# Patient Record
Sex: Female | Born: 1965 | Race: Asian | Hispanic: No | Marital: Married | Smoking: Never smoker
Health system: Southern US, Community
[De-identification: ages and names within clinical notes are randomized; demographics above are authoritative.]

## PROBLEM LIST (undated history)

## (undated) DIAGNOSIS — R7309 Other abnormal glucose: Secondary | ICD-10-CM

## (undated) DIAGNOSIS — E042 Nontoxic multinodular goiter: Secondary | ICD-10-CM

## (undated) DIAGNOSIS — E039 Hypothyroidism, unspecified: Secondary | ICD-10-CM

## (undated) DIAGNOSIS — D649 Anemia, unspecified: Secondary | ICD-10-CM

## (undated) HISTORY — PX: BREAST BIOPSY: SHX20

## (undated) HISTORY — DX: Hypothyroidism, unspecified: E03.9

## (undated) HISTORY — DX: Nontoxic multinodular goiter: E04.2

## (undated) HISTORY — DX: Other abnormal glucose: R73.09

---

## 2004-05-19 HISTORY — PX: OVARIAN CYST SURGERY: SHX726

## 2006-03-31 ENCOUNTER — Ambulatory Visit: Payer: Self-pay | Admitting: Internal Medicine

## 2006-04-01 ENCOUNTER — Ambulatory Visit (HOSPITAL_COMMUNITY): Admission: RE | Admit: 2006-04-01 | Discharge: 2006-04-01 | Payer: Self-pay | Admitting: Internal Medicine

## 2006-04-14 ENCOUNTER — Ambulatory Visit: Payer: Self-pay | Admitting: Internal Medicine

## 2006-07-14 ENCOUNTER — Ambulatory Visit: Payer: Self-pay | Admitting: Internal Medicine

## 2006-07-14 LAB — CONVERTED CEMR LAB: TSH: 0.02 microintl units/mL — ABNORMAL LOW (ref 0.35–5.50)

## 2006-09-22 ENCOUNTER — Ambulatory Visit: Payer: Self-pay | Admitting: Internal Medicine

## 2006-09-22 LAB — CONVERTED CEMR LAB: TSH: 0.1 microintl units/mL — ABNORMAL LOW (ref 0.35–5.50)

## 2006-10-08 ENCOUNTER — Encounter: Admission: RE | Admit: 2006-10-08 | Discharge: 2006-10-08 | Payer: Self-pay | Admitting: Internal Medicine

## 2006-10-22 ENCOUNTER — Ambulatory Visit: Payer: Self-pay | Admitting: Internal Medicine

## 2006-10-22 LAB — CONVERTED CEMR LAB: TSH: 0.33 microintl units/mL — ABNORMAL LOW (ref 0.35–5.50)

## 2006-11-26 ENCOUNTER — Ambulatory Visit: Payer: Self-pay | Admitting: Internal Medicine

## 2006-11-26 LAB — CONVERTED CEMR LAB: TSH: 3.29 microintl units/mL (ref 0.35–5.50)

## 2006-12-05 ENCOUNTER — Ambulatory Visit: Payer: Self-pay | Admitting: Internal Medicine

## 2006-12-05 ENCOUNTER — Ambulatory Visit (HOSPITAL_COMMUNITY): Admission: RE | Admit: 2006-12-05 | Discharge: 2006-12-05 | Payer: Self-pay | Admitting: Internal Medicine

## 2006-12-05 LAB — CONVERTED CEMR LAB
Anti Nuclear Antibody(ANA): NEGATIVE
Rheumatoid fact SerPl-aCnc: <20 intl units/mL — ABNORMAL LOW (ref 0.0–20.0)
Sed Rate: 10 mm/hr (ref 0–25)
Uric Acid, Serum: 3.6 mg/dL (ref 2.4–7.0)

## 2007-04-09 ENCOUNTER — Ambulatory Visit: Payer: Self-pay | Admitting: Internal Medicine

## 2007-04-09 LAB — CONVERTED CEMR LAB: TSH: 1.66 microintl units/mL (ref 0.35–5.50)

## 2007-10-12 ENCOUNTER — Ambulatory Visit: Payer: Self-pay | Admitting: Internal Medicine

## 2007-10-12 DIAGNOSIS — E039 Hypothyroidism, unspecified: Secondary | ICD-10-CM

## 2007-10-13 LAB — CONVERTED CEMR LAB: TSH: 5.15 microintl units/mL (ref 0.35–5.50)

## 2007-10-14 ENCOUNTER — Telehealth: Payer: Self-pay | Admitting: Internal Medicine

## 2007-10-14 DIAGNOSIS — E049 Nontoxic goiter, unspecified: Secondary | ICD-10-CM | POA: Insufficient documentation

## 2007-10-21 ENCOUNTER — Encounter (INDEPENDENT_AMBULATORY_CARE_PROVIDER_SITE_OTHER): Payer: Self-pay | Admitting: *Deleted

## 2007-11-12 ENCOUNTER — Encounter: Admission: RE | Admit: 2007-11-12 | Discharge: 2007-11-12 | Payer: Self-pay | Admitting: Endocrinology

## 2007-11-23 ENCOUNTER — Ambulatory Visit: Payer: Self-pay | Admitting: Internal Medicine

## 2007-11-23 LAB — CONVERTED CEMR LAB: TSH: 0.14 microintl units/mL — ABNORMAL LOW (ref 0.35–5.50)

## 2008-01-06 ENCOUNTER — Ambulatory Visit: Payer: Self-pay | Admitting: Internal Medicine

## 2008-01-06 LAB — CONVERTED CEMR LAB: TSH: 0.17 microintl units/mL — ABNORMAL LOW (ref 0.35–5.50)

## 2008-01-07 ENCOUNTER — Telehealth: Payer: Self-pay | Admitting: Internal Medicine

## 2008-04-29 ENCOUNTER — Ambulatory Visit: Payer: Self-pay | Admitting: Internal Medicine

## 2008-04-29 LAB — CONVERTED CEMR LAB: TSH: 1.1 microintl units/mL (ref 0.35–5.50)

## 2008-04-30 ENCOUNTER — Telehealth: Payer: Self-pay | Admitting: Internal Medicine

## 2008-10-13 ENCOUNTER — Telehealth: Payer: Self-pay | Admitting: Internal Medicine

## 2008-10-17 ENCOUNTER — Ambulatory Visit: Payer: Self-pay | Admitting: Internal Medicine

## 2008-10-17 ENCOUNTER — Telehealth: Payer: Self-pay | Admitting: Internal Medicine

## 2008-10-17 DIAGNOSIS — D508 Other iron deficiency anemias: Secondary | ICD-10-CM | POA: Insufficient documentation

## 2008-10-17 LAB — CONVERTED CEMR LAB
ALT: 14 units/L (ref 0–35)
AST: 20 units/L (ref 0–37)
BUN: 9 mg/dL (ref 6–23)
Basophils Absolute: 0.1 10*3/uL (ref 0.0–0.1)
Basophils Relative: 1.8 % (ref 0.0–3.0)
CO2: 27 meq/L (ref 19–32)
Calcium: 9 mg/dL (ref 8.4–10.5)
Chloride: 107 meq/L (ref 96–112)
Cholesterol: 167 mg/dL (ref 0–200)
Creatinine, Ser: 0.4 mg/dL (ref 0.4–1.2)
Eosinophils Absolute: 0.4 10*3/uL (ref 0.0–0.7)
Eosinophils Relative: 7.6 % — ABNORMAL HIGH (ref 0.0–5.0)
GFR calc Af Amer: 223 mL/min
GFR calc non Af Amer: 184 mL/min
Glucose, Bld: 101 mg/dL — ABNORMAL HIGH (ref 70–99)
HCT: 25.2 % — ABNORMAL LOW (ref 36.0–46.0)
HDL: 39.8 mg/dL (ref >39.0)
Hemoglobin: 7.6 g/dL — CL (ref 12.0–15.0)
LDL Cholesterol: 112 mg/dL — ABNORMAL HIGH (ref 0–99)
Lymphocytes Relative: 38.1 % (ref 12.0–46.0)
MCHC: 30.1 g/dL (ref 30.0–36.0)
MCV: 65.8 fL — ABNORMAL LOW (ref 78.0–100.0)
Monocytes Absolute: 0.5 10*3/uL (ref 0.1–1.0)
Monocytes Relative: 10.4 % (ref 3.0–12.0)
Neutro Abs: 2.1 10*3/uL (ref 1.4–7.7)
Neutrophils Relative %: 42.1 % — ABNORMAL LOW (ref 43.0–77.0)
Platelets: 379 10*3/uL (ref 150–400)
Potassium: 4.3 meq/L (ref 3.5–5.1)
RBC: 3.82 M/uL — ABNORMAL LOW (ref 3.87–5.11)
RDW: 18 % — ABNORMAL HIGH (ref 11.5–14.6)
Sodium: 139 meq/L (ref 135–145)
TSH: 0.45 microintl units/mL (ref 0.35–5.50)
Total CHOL/HDL Ratio: 4.2
Triglycerides: 77 mg/dL (ref 0–149)
VLDL: 15 mg/dL (ref 0–40)
WBC: 5 10*3/uL (ref 4.5–10.5)

## 2008-10-19 ENCOUNTER — Ambulatory Visit (HOSPITAL_BASED_OUTPATIENT_CLINIC_OR_DEPARTMENT_OTHER): Admission: RE | Admit: 2008-10-19 | Discharge: 2008-10-19 | Payer: Self-pay | Admitting: Internal Medicine

## 2008-10-19 ENCOUNTER — Ambulatory Visit: Payer: Self-pay | Admitting: Interventional Radiology

## 2008-11-21 ENCOUNTER — Ambulatory Visit: Payer: Self-pay | Admitting: Internal Medicine

## 2008-11-21 LAB — CONVERTED CEMR LAB
Basophils Absolute: 0.1 10*3/uL (ref 0.0–0.1)
Basophils Relative: 0.8 % (ref 0.0–3.0)
Eosinophils Absolute: 0.2 10*3/uL (ref 0.0–0.7)
Eosinophils Relative: 3.3 % (ref 0.0–5.0)
HCT: 36.4 % (ref 36.0–46.0)
Hemoglobin: 11.9 g/dL — ABNORMAL LOW (ref 12.0–15.0)
Lymphocytes Relative: 26.3 % (ref 12.0–46.0)
Lymphs Abs: 1.9 10*3/uL (ref 0.7–4.0)
MCHC: 32.7 g/dL (ref 30.0–36.0)
MCV: 79.4 fL (ref 78.0–100.0)
Monocytes Absolute: 0.6 10*3/uL (ref 0.1–1.0)
Monocytes Relative: 8.3 % (ref 3.0–12.0)
Neutro Abs: 4.5 10*3/uL (ref 1.4–7.7)
Neutrophils Relative %: 61.3 % (ref 43.0–77.0)
Platelets: 271 10*3/uL (ref 150.0–400.0)
RBC: 4.58 M/uL (ref 3.87–5.11)
RDW: 30.5 % — ABNORMAL HIGH (ref 11.5–14.6)
WBC: 7.3 10*3/uL (ref 4.5–10.5)

## 2008-11-27 ENCOUNTER — Telehealth: Payer: Self-pay | Admitting: Internal Medicine

## 2009-02-02 ENCOUNTER — Ambulatory Visit: Payer: Self-pay | Admitting: Internal Medicine

## 2009-02-02 ENCOUNTER — Telehealth: Payer: Self-pay | Admitting: Internal Medicine

## 2009-02-02 LAB — CONVERTED CEMR LAB
Basophils Absolute: 0 10*3/uL (ref 0.0–0.1)
Basophils Relative: 0.3 % (ref 0.0–3.0)
Eosinophils Absolute: 0.3 10*3/uL (ref 0.0–0.7)
Eosinophils Relative: 4.5 % (ref 0.0–5.0)
HCT: 34.8 % — ABNORMAL LOW (ref 36.0–46.0)
Hemoglobin: 11.7 g/dL — ABNORMAL LOW (ref 12.0–15.0)
Lymphocytes Relative: 27.3 % (ref 12.0–46.0)
Lymphs Abs: 2.1 10*3/uL (ref 0.7–4.0)
MCHC: 33.5 g/dL (ref 30.0–36.0)
MCV: 89.1 fL (ref 78.0–100.0)
Monocytes Absolute: 0.7 10*3/uL (ref 0.1–1.0)
Monocytes Relative: 9.7 % (ref 3.0–12.0)
Neutro Abs: 4.5 10*3/uL (ref 1.4–7.7)
Neutrophils Relative %: 58.2 % (ref 43.0–77.0)
Platelets: 254 10*3/uL (ref 150.0–400.0)
RBC: 3.9 M/uL (ref 3.87–5.11)
RDW: 12.8 % (ref 11.5–14.6)
WBC: 7.6 10*3/uL (ref 4.5–10.5)

## 2009-08-15 IMAGING — US US SOFT TISSUE HEAD/NECK
1 series · 14 of 25 positions shown · non-contrast
Comparison: 11/12/2007

CLINICAL DATA: Goiter

THYROID ULTRASOUND
TECHNIQUE: Ultrasound examination of the thyroid gland and
adjacent soft tissues was performed.

[Series 1: us soft tissue head/neck · 0.07mm/px · 14 of 27 slices shown]
[im 1/27]
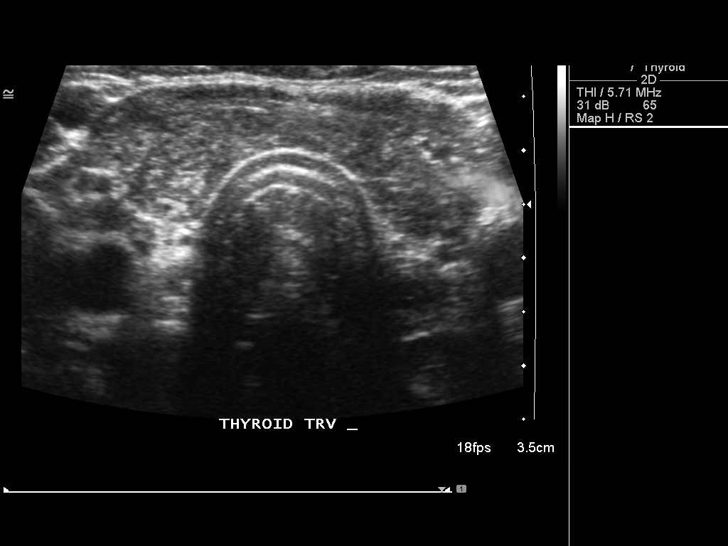
[im 3/27]
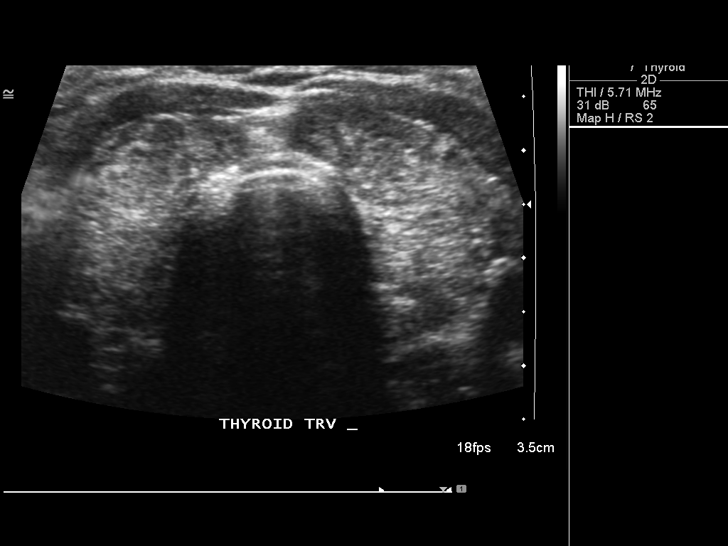
[im 5/27]
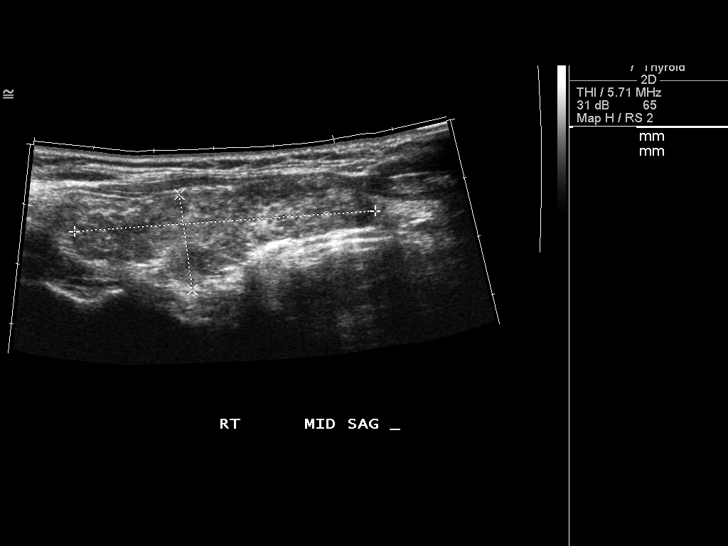
[im 7/27]
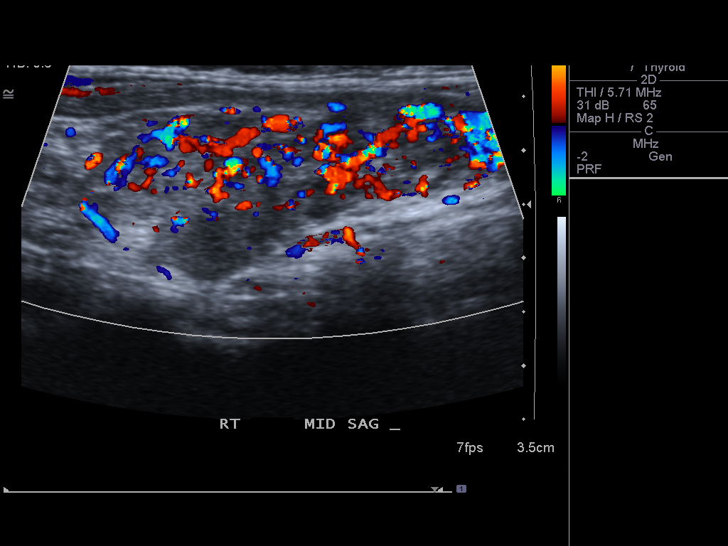
[im 9/27]
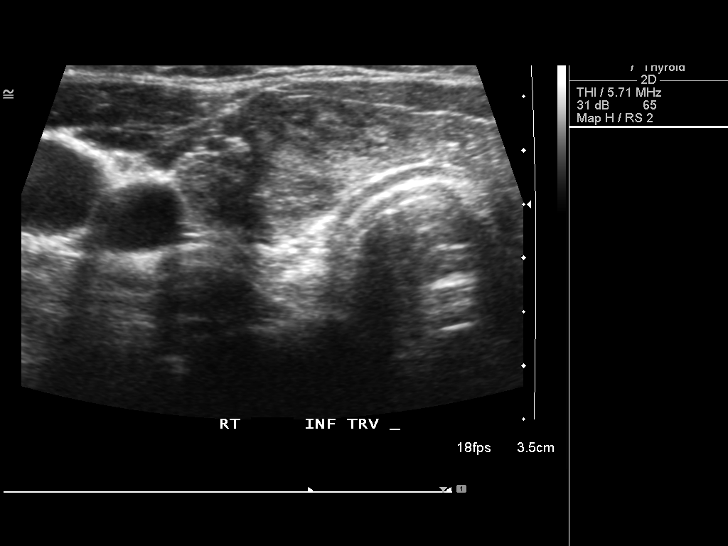
[im 10/27]
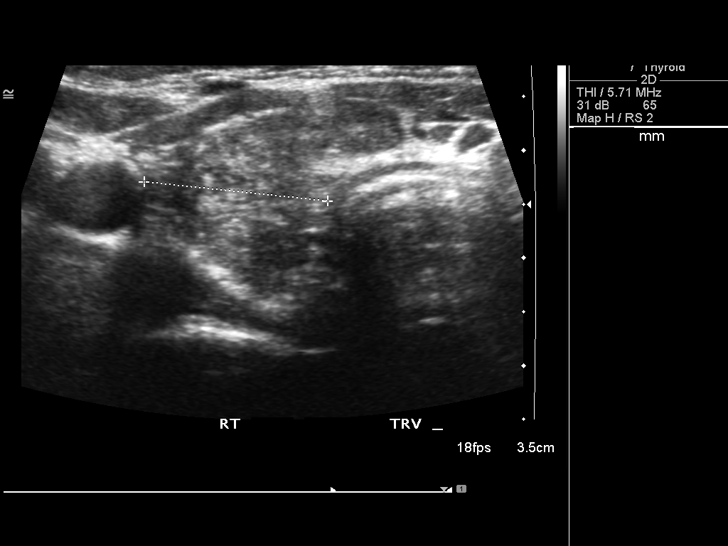
[im 12/27]
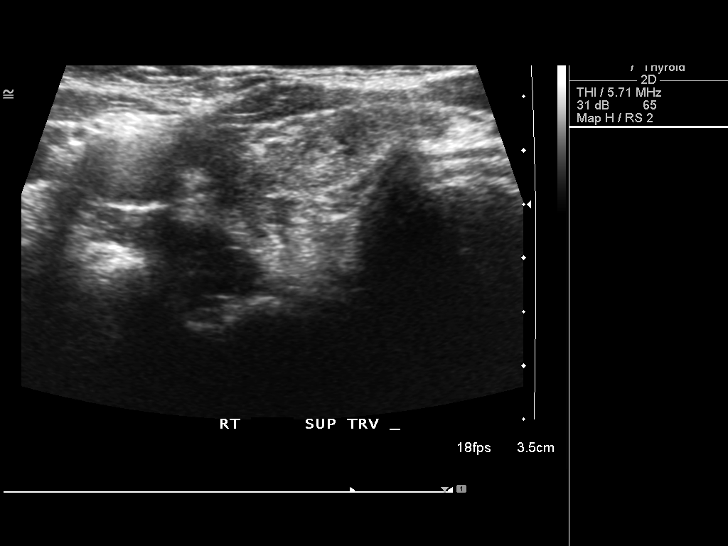
[im 15/27]
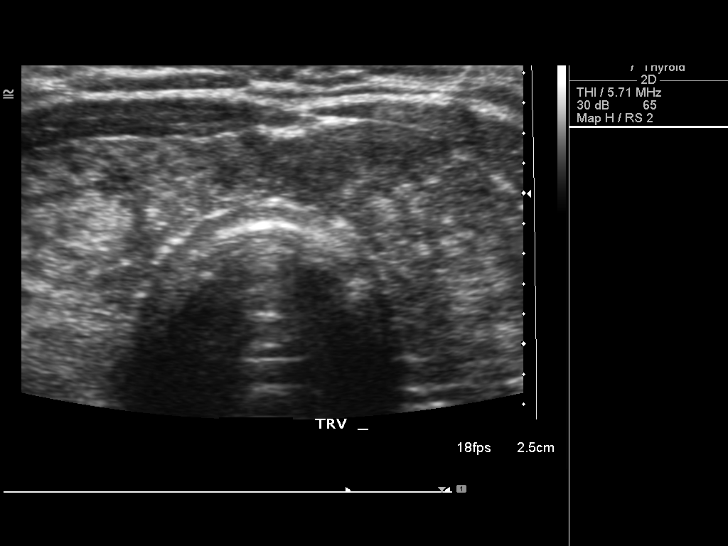
[im 17/27]
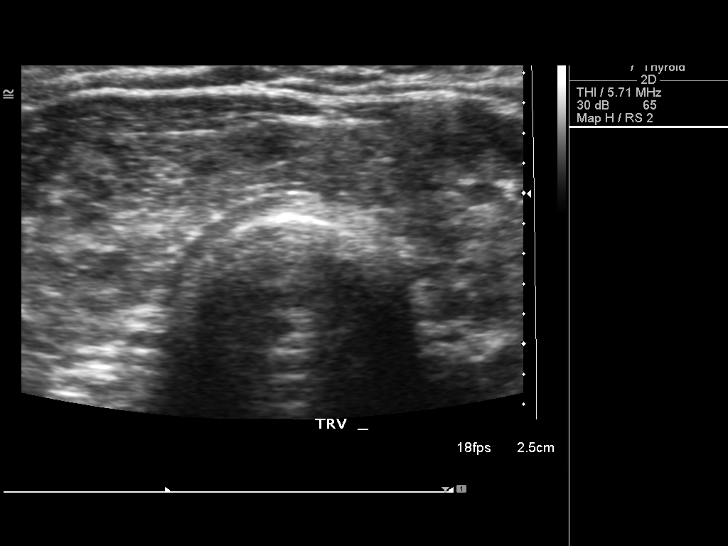
[im 18/27]
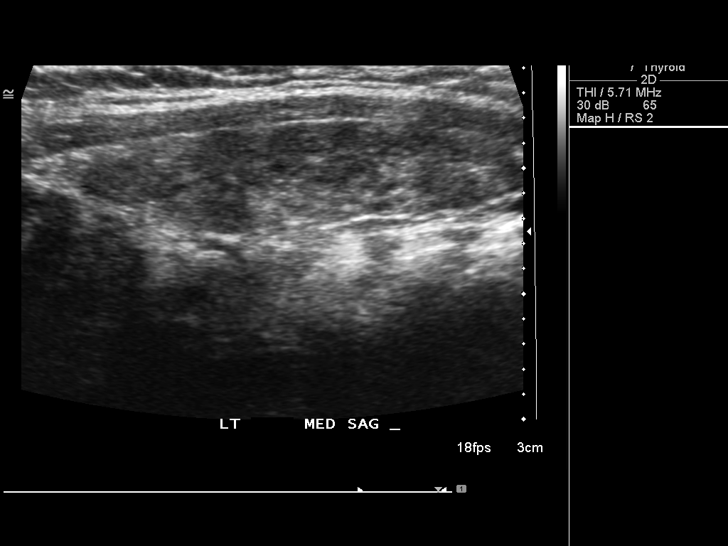
[im 20/27]
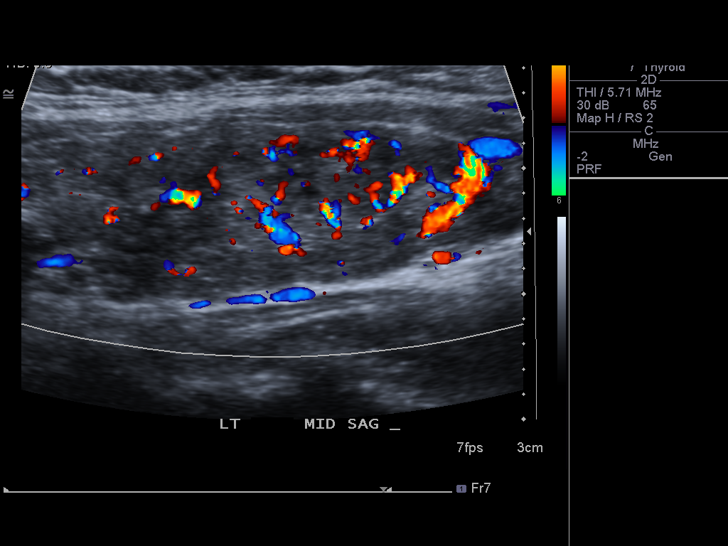
[im 22/27]
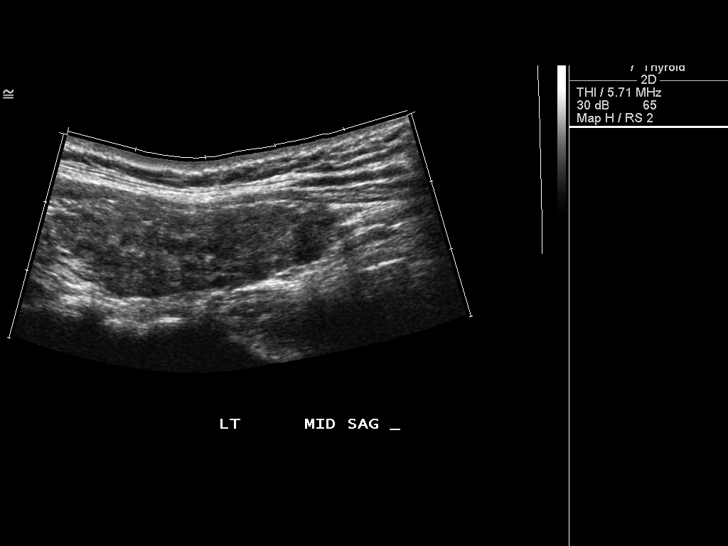
[im 24/27]
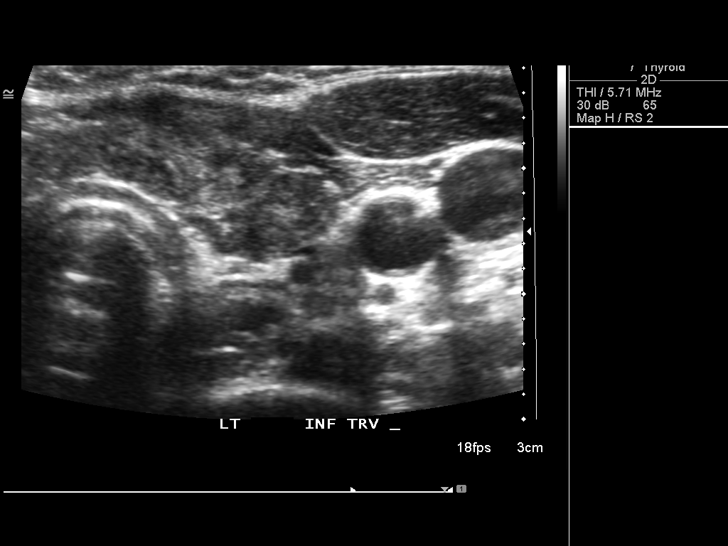
[im 27/27]
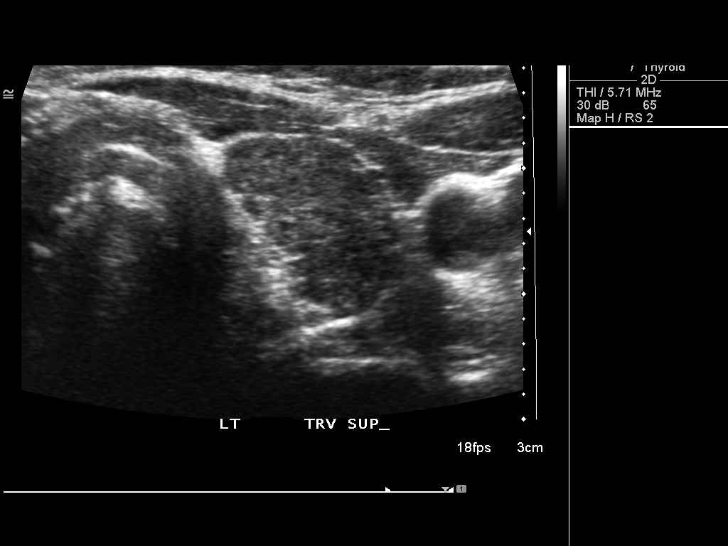

[14 of 25 positions shown; findings below may reference images not displayed]

FINDINGS: Mild goiter shows no significant change in overall size
since prior study, with the right lobe measuring 16 x 17 x 15 mm,
the left lobe measuring 14 x 15 x 43 mm. The thyroid isthmus
measures 5 mm in thickness.

The thyroid gland shows diffusely inhomogeneous echo texture. No
discrete or dominant thyroid nodules are identified on today's
study.
IMPRESSION: Stable mild goiter, with diffusely inhomogeneous thyroid
echotexture. No discrete or dominant thyroid nodule identified.

## 2009-08-24 ENCOUNTER — Telehealth: Payer: Self-pay | Admitting: Internal Medicine

## 2009-08-28 ENCOUNTER — Ambulatory Visit: Payer: Self-pay | Admitting: Internal Medicine

## 2009-08-28 LAB — CONVERTED CEMR LAB
Basophils Absolute: 0.1 10*3/uL (ref 0.0–0.1)
Basophils Relative: 1 % (ref 0.0–3.0)
Eosinophils Absolute: 0.2 10*3/uL (ref 0.0–0.7)
Eosinophils Relative: 2.6 % (ref 0.0–5.0)
HCT: 30.7 % — ABNORMAL LOW (ref 36.0–46.0)
Hemoglobin: 9.9 g/dL — ABNORMAL LOW (ref 12.0–15.0)
Lymphocytes Relative: 32.2 % (ref 12.0–46.0)
Lymphs Abs: 2 10*3/uL (ref 0.7–4.0)
MCHC: 32.2 g/dL (ref 30.0–36.0)
MCV: 84.8 fL (ref 78.0–100.0)
Monocytes Absolute: 0.6 10*3/uL (ref 0.1–1.0)
Monocytes Relative: 9.9 % (ref 3.0–12.0)
Neutro Abs: 3.2 10*3/uL (ref 1.4–7.7)
Neutrophils Relative %: 54.3 % (ref 43.0–77.0)
Platelets: 340 10*3/uL (ref 150.0–400.0)
RBC: 3.62 M/uL — ABNORMAL LOW (ref 3.87–5.11)
RDW: 13.1 % (ref 11.5–14.6)
TSH: 0.84 microintl units/mL (ref 0.35–5.50)
WBC: 6.1 10*3/uL (ref 4.5–10.5)

## 2009-08-30 ENCOUNTER — Telehealth: Payer: Self-pay | Admitting: Internal Medicine

## 2009-08-30 ENCOUNTER — Encounter (INDEPENDENT_AMBULATORY_CARE_PROVIDER_SITE_OTHER): Payer: Self-pay | Admitting: *Deleted

## 2009-10-31 ENCOUNTER — Telehealth: Payer: Self-pay | Admitting: Internal Medicine

## 2009-10-31 ENCOUNTER — Ambulatory Visit: Payer: Self-pay | Admitting: Diagnostic Radiology

## 2009-10-31 ENCOUNTER — Ambulatory Visit (HOSPITAL_BASED_OUTPATIENT_CLINIC_OR_DEPARTMENT_OTHER): Admission: RE | Admit: 2009-10-31 | Discharge: 2009-10-31 | Payer: Self-pay | Admitting: Internal Medicine

## 2010-02-08 ENCOUNTER — Telehealth: Payer: Self-pay | Admitting: Internal Medicine

## 2010-02-09 ENCOUNTER — Ambulatory Visit: Payer: Self-pay | Admitting: Internal Medicine

## 2010-02-09 ENCOUNTER — Telehealth: Payer: Self-pay | Admitting: Internal Medicine

## 2010-02-09 LAB — CONVERTED CEMR LAB
Basophils Absolute: 0 10*3/uL (ref 0.0–0.1)
Basophils Relative: 0.8 % (ref 0.0–3.0)
Eosinophils Absolute: 0.2 10*3/uL (ref 0.0–0.7)
Eosinophils Relative: 3.6 % (ref 0.0–5.0)
HCT: 27.9 % — ABNORMAL LOW (ref 36.0–46.0)
Hemoglobin: 9.1 g/dL — ABNORMAL LOW (ref 12.0–15.0)
Lymphocytes Relative: 31.2 % (ref 12.0–46.0)
Lymphs Abs: 2 10*3/uL (ref 0.7–4.0)
MCHC: 32.7 g/dL (ref 30.0–36.0)
MCV: 75.8 fL — ABNORMAL LOW (ref 78.0–100.0)
Monocytes Absolute: 0.6 10*3/uL (ref 0.1–1.0)
Monocytes Relative: 9.8 % (ref 3.0–12.0)
Neutro Abs: 3.4 10*3/uL (ref 1.4–7.7)
Neutrophils Relative %: 54.6 % (ref 43.0–77.0)
Platelets: 317 10*3/uL (ref 150.0–400.0)
RBC: 3.68 M/uL — ABNORMAL LOW (ref 3.87–5.11)
RDW: 19.4 % — ABNORMAL HIGH (ref 11.5–14.6)
TSH: 0.16 microintl units/mL — ABNORMAL LOW (ref 0.35–5.50)
WBC: 6.3 10*3/uL (ref 4.5–10.5)

## 2010-04-02 LAB — HM MAMMOGRAPHY: HM Mammogram: NORMAL

## 2010-04-02 LAB — CONVERTED CEMR LAB: Pap Smear: NORMAL

## 2010-05-17 ENCOUNTER — Telehealth: Payer: Self-pay | Admitting: Internal Medicine

## 2010-05-22 ENCOUNTER — Ambulatory Visit: Payer: Self-pay | Admitting: Internal Medicine

## 2010-05-22 ENCOUNTER — Ambulatory Visit (HOSPITAL_BASED_OUTPATIENT_CLINIC_OR_DEPARTMENT_OTHER): Admission: RE | Admit: 2010-05-22 | Discharge: 2010-05-22 | Payer: Self-pay | Admitting: Internal Medicine

## 2010-05-22 ENCOUNTER — Ambulatory Visit: Payer: Self-pay | Admitting: Diagnostic Radiology

## 2010-05-22 DIAGNOSIS — J309 Allergic rhinitis, unspecified: Secondary | ICD-10-CM | POA: Insufficient documentation

## 2010-05-22 DIAGNOSIS — R05 Cough: Secondary | ICD-10-CM | POA: Insufficient documentation

## 2010-05-22 LAB — CONVERTED CEMR LAB
Basophils Absolute: 0.1 10*3/uL (ref 0.0–0.1)
Basophils Relative: 1 % (ref 0–1)
Eosinophils Absolute: 0.2 10*3/uL (ref 0.0–0.7)
Eosinophils Relative: 3 % (ref 0–5)
HCT: 37.3 % (ref 36.0–46.0)
Hemoglobin: 11.2 g/dL — ABNORMAL LOW (ref 12.0–15.0)
IgE (Immunoglobulin E), Serum: 67.3 intl units/mL (ref 0.0–180.0)
Lymphocytes Relative: 24 % (ref 12–46)
Lymphs Abs: 1.6 10*3/uL (ref 0.7–4.0)
MCHC: 30 g/dL (ref 30.0–36.0)
MCV: 84 fL (ref 78.0–100.0)
Monocytes Absolute: 0.6 10*3/uL (ref 0.1–1.0)
Monocytes Relative: 9 % (ref 3–12)
Neutro Abs: 4.1 10*3/uL (ref 1.7–7.7)
Neutrophils Relative %: 63 % (ref 43–77)
Platelets: 367 10*3/uL (ref 150–400)
RBC: 4.44 M/uL (ref 3.87–5.11)
RDW: 17.9 % — ABNORMAL HIGH (ref 11.5–15.5)
TSH: 3.771 microintl units/mL (ref 0.350–4.500)
WBC: 6.4 10*3/uL (ref 4.0–10.5)

## 2010-05-24 ENCOUNTER — Telehealth: Payer: Self-pay | Admitting: Internal Medicine

## 2010-08-27 ENCOUNTER — Ambulatory Visit: Admit: 2010-08-27 | Payer: Self-pay | Admitting: Internal Medicine

## 2010-09-18 NOTE — Progress Notes (Signed)
Summary: Lab Results  Phone Note Outgoing Call   Summary of Call: call pt - blood work shows she needs to go back to higher dose of synthroid.  see rx allergy panel shows sensitivity to dust mites,  cat dander,  grass pollen, tree pollen, and ragweed  I suggest she use dust mite mattress cover,  dust mite pillow cover. she should wash bedding regularly in hot water 140 degrees  avoid using ceiling fan in bedroom if she works in the yard, then change clothes as soon as she comes indoors  anemia has slightly improved -  Hg is 11.2  plz arrange TSH in 3 months 244.9  Initial call taken by: D. Thomos Lemons DO,  May 24, 2010 10:59 AM  Follow-up for Phone Call        call placed to patient at [phone], she has been advised per Dr Artist Pais instructions Follow-up by: Glendell Docker CMA,  May 24, 2010 11:47 AM    New/Updated Medications: SYNTHROID 88 MCG TABS (LEVOTHYROXINE SODIUM) one by mouth qd Prescriptions: SYNTHROID 88 MCG TABS (LEVOTHYROXINE SODIUM) one by mouth qd  #90 x 1   Entered and Authorized by:   D. Thomos Lemons DO   Signed by:   D. Thomos Lemons DO on 05/24/2010   Method used:   Electronically to        [facility]  [phone]* (retail)       [address]       [place], [place]  [address]       Ph: [phone] or [phone]       Fax: [phone]   RxID:   [id number]

## 2010-09-18 NOTE — Progress Notes (Signed)
  Phone Note Outgoing Call   Summary of Call: call pt - thyroid u/s stable Initial call taken by: D. Thomos Lemons DO,  October 31, 2009 1:03 PM  Follow-up for Phone Call        informed patient that U/S is stable Follow-up by: Michaelle Copas,  November 02, 2009 9:42 AM

## 2010-09-18 NOTE — Assessment & Plan Note (Signed)
Summary: COUGH/BLOOD WORK/FLU SHOT/DK   Vital Signs:  Patient profile:   45 year old female Height:      65 inches Weight:      129.50 pounds BMI:     21.63 O2 Sat:      100 % on Room air Temp:     98.2 degrees F oral Pulse rate:   72 / minute Pulse rhythm:   regular Resp:     16 per minute BP sitting:   116 / 80  (right arm) Cuff size:   regular  Vitals Entered By: Glendell Docker CMA (May 22, 2010 8:15 AM)  O2 Flow:  Room air CC: Cough Is Patient Diabetic? No Pain Assessment Patient in pain? no      Comments c/o chronic dry cough, follow up on labs , flus shot   Primary Care Provider:  Dondra Spry DO  CC:  Cough.  History of Present Illness: 45 y/o [place] female c/o chronic dry cough worse at night she is worried about  pertussis cough was worse over spring summer was better  now coughing more with fall  denies chronic heartburn slight upset stomach if she drinks coffee in AM on empty stomach  Preventive Screening-Counseling & Management  Alcohol-Tobacco     Smoking Status: never  Allergies: No Known Drug Allergies  Past History:  Past Medical History: Hypothyroidism Multinodular goiter   Past Surgical History: Ovarian Cyst 05/2004 Breast Biopsy >29yrs    Family History: CAD - no Stoke -no DM -no Htn - no Hyperlipidemia - no Breast Ca - no Colon Ca - no Prostate Ca -  no    Social History: Occupation: Retail banker (dry cleaning business) 1 daughter in college [employer], 1 son at [employer] college in [place] Married Never Smoked Alcohol use-no  Physical Exam  General:  alert, well-developed, and well-nourished.   Mouth:  postnasal drip.   Neck:  No deformities, masses, or tenderness noted. Lungs:  normal respiratory effort, normal breath sounds, no crackles, and no wheezes.   Heart:  normal rate, regular rhythm, and no gallop.   Extremities:  No lower extremity edema    Impression & Recommendations:  Problem  # 1:  COUGH, CHRONIC (ICD-786.2) chronic cough likely from allergic rhinitis.  cxr negative for air space dz use allegra and flonase as directed. Patient advised to call office if symptoms persist or worsen.  Orders: T-2 View CXR, Same Day (71020.5TC)  Problem # 2:  ANEMIA, IRON DEFICIENCY, MICROCYTIC (ICD-280.8) monitor CBC  Problem # 3:  UNSPECIFIED HYPOTHYROIDISM (ICD-244.9) monitor TFTs  Her updated medication list for this problem includes:    Synthroid 75 Mcg Tabs (Levothyroxine sodium) ..... One by mouth once daily  Complete Medication List: 1)  Synthroid 75 Mcg Tabs (Levothyroxine sodium) .... One by mouth once daily 2)  Fexofenadine Hcl 180 Mg Tabs (Fexofenadine hcl) .... One by mouth once daily 3)  Fluticasone Propionate 50 Mcg/act Susp (Fluticasone propionate) .... 2 sprays each nostril once daily  Other Orders: T- * Misc. Laboratory test [phone]) Tdap => 49yrs IM (60454) Admin 1st Vaccine (09811) Flu Vaccine 3yrs + (91478) Admin of Any Addtl Vaccine (29562)  Patient Instructions: 1)  Call our office if your symptoms do not  improve or gets worse. 2)  Please schedule a follow-up appointment in 6 months. Prescriptions: FLUTICASONE PROPIONATE 50 MCG/ACT SUSP (FLUTICASONE PROPIONATE) 2 sprays each nostril once daily  #1 x 3   Entered and Authorized by:   D.  Thomos Lemons DO   Signed by:   D. Thomos Lemons DO on 05/22/2010   Method used:   Electronically to        [facility]  [phone]* (retail)       [address]       [place], [place]  [address]       Ph: [phone] or [phone]       Fax: [phone]   RxID:   [id number] FEXOFENADINE HCL 180 MG TABS (FEXOFENADINE HCL) one by mouth once daily  #90 x 1   Entered and Authorized by:   D. Thomos Lemons DO   Signed by:   D. Thomos Lemons DO on 05/22/2010   Method used:   Electronically to        [facility]  [phone]* (retail)       [address]       [place], [place]  [address]       Ph: [phone] or [phone]       Fax: [phone]   RxID:   [id number]    Preventive Care Screening  Mammogram:    Date:  04/02/2010    Results:  normal   Pap Smear:    Date:  04/02/2010    Results:  normal     Immunizations Administered:  Tetanus Vaccine:    Vaccine Type: Tdap    Site: left deltoid    Mfr: GlaxoSmithKline    Dose: 0.5 ml    Route: IM    Given by: Glendell Docker CMA    Exp. Date: 06/07/2012    Lot #: [id number]    VIS given: 07/06/08 version given May 22, 2010.  Influenza Vaccine # 1:    Vaccine Type: Fluvax 3+    Site: right deltoid    Mfr: GlaxoSmithKline    Dose: 0.5 ml    Route: IM    Given by: Glendell Docker CMA    Exp. Date: 02/16/2011    Lot #: [id number]    VIS given: 03/13/10 version given May 22, 2010.  Flu Vaccine Consent Questions:    Do you have a history of severe allergic reactions to this vaccine? no    Any prior history of allergic reactions to egg and/or gelatin? no    Do you have a sensitivity to the preservative Thimersol? no    Do you have a past history of Guillan-Barre Syndrome? no    Do you currently have an acute febrile illness? no    Have you ever had a severe reaction to latex? no    Vaccine information given and explained to patient? yes    Are you currently pregnant? no

## 2010-09-18 NOTE — Progress Notes (Signed)
Summary: Lab Results  Phone Note Outgoing Call   Summary of Call: call pt - blood test shows pt getting too much thyroid hormone.  see rx for lower dose.   anemia is slightly worse.  Please make sure pt taking her iron supplement daily. If she is having persistent heavy menstrual periods, I suggest f/u with GYN to discuss tx options. arrange repeat cbc, tsh in 2 months  Initial call taken by: D. Thomos Lemons DO,  February 09, 2010 5:18 PM  Follow-up for Phone Call        call placed to patient at [phone], she was advised per Dr Artist Pais instructions. Patient asked that I contact her daughter with the information due to language barrier. Call was placed to patients daughter Andres Labrum at [phone], she was advised per Dr Artist Pais instructions as requested by patient and has verbalized understanding Follow-up by: Glendell Docker CMA,  February 09, 2010 5:53 PM    New/Updated Medications: SYNTHROID 75 MCG TABS (LEVOTHYROXINE SODIUM) one by mouth once daily Prescriptions: SYNTHROID 75 MCG TABS (LEVOTHYROXINE SODIUM) one by mouth once daily  #90 x 0   Entered and Authorized by:   D. Thomos Lemons DO   Signed by:   D. Thomos Lemons DO on 02/09/2010   Method used:   Electronically to        [facility]  [phone]* (retail)       [address]       [place], [place]  [address]       Ph: [phone] or [phone]       Fax: [phone]   RxID:   [id number]

## 2010-09-18 NOTE — Progress Notes (Signed)
Summary: Lab order  Phone Note Call from Patient Call back at Home Phone [phone]   Caller: Patient Call For: D. Thomos Lemons DO Summary of Call: Pt would like to come in for bloodwork tomorrow Initial call taken by: Lannette Donath,  February 08, 2010 12:38 PM  Follow-up for Phone Call        TSH  244.9 CBC 280.8  ask pt but I think she prefers labs at [facility] Follow-up by: D. Thomos Lemons DO,  February 08, 2010 5:26 PM  Additional Follow-up for Phone Call Additional follow up Details #1::        labs have been entered for [facility], call placed to patient she has been advised of lab order Additional Follow-up by: Glendell Docker CMA,  February 09, 2010 2:32 PM

## 2010-09-18 NOTE — Letter (Signed)
Summary: Primary Care Consult Scheduled Letter  [facility]  [address]   [place], [place] [address]   Phone: [phone]  Fax: [phone]      08/30/2009 MRN: [id number]  [facility] [address] [place], [place]  [address]    Dear Ms. Obriant,      We have scheduled an appointment for you.  At the recommendation of Dr.Yoo, we have scheduled you a Throid UltraSound @ [facility]  on March 15,2011 at 9:15am .  Their address is [address] . The office phone number is [phone].  If this appointment day and time is not convenient for you, please feel free to call the office of the doctor you are being referred to at the number listed above and reschedule the appointment.     It is important for you to keep your scheduled appointments. We are here to make sure you are given good patient care. If you have questions or you have made changes to your appointment, please notify us at  [phone], ask for [facility].    Thank you,  Patient Care Coordinator [facility]

## 2010-09-18 NOTE — Progress Notes (Signed)
  Phone Note Outgoing Call   Summary of Call: call pt - have her go to [facility] lab for TSH 244.90 and CBCD  280.8 Initial call taken by: D. Thomos Lemons DO,  August 24, 2009 5:13 PM  Follow-up for Phone Call        spoke with pt. and scheduled her to go to [facility] for labs on 08/28/09 Follow-up by: Michaelle Copas,  August 25, 2009 2:27 PM

## 2010-09-18 NOTE — Progress Notes (Signed)
Summary: Lab Work  Engineer, building services of Call: please call pt re: setting up follow up thyroid studies. Initial call taken by: D. Thomos Lemons DO,  May 17, 2010 4:02 PM  Follow-up for Phone Call        voice message left by patients daughter Eu Shon Hough regarding patients blood work. She states patient is requesting appointment with Dr Artist Pais to evaluate a cough that she has. She would like to get  a flu shot.  Follow-up by: Glendell Docker CMA,  May 17, 2010 5:34 PM  Additional Follow-up for Phone Call Additional follow up Details #1::        call was returned to patient at [phone], she was informed appointment was needed. Appointment has been scheduled for 10/4 @ 8am with Dr Artist Pais  Additional Follow-up by: Glendell Docker CMA,  May 17, 2010 5:35 PM

## 2010-09-18 NOTE — Progress Notes (Signed)
Summary: Lab results  Phone Note Outgoing Call   Summary of Call: call pt - thyroid blood test normal.  keep taking same dose of thyroid medication.    she is anemic again.  please advise pt to take iron supplement regularly.  I suggest we repeat CBC in 2 months.    I will also arrange f/u thyroid u/s in March Initial call taken by: D. Thomos Lemons DO,  August 30, 2009 10:02 AM  Follow-up for Phone Call        call placed to patient at [phone], she was advised per Dr Artist Pais instructions. Follow-up by: Glendell Docker CMA,  August 30, 2009 4:20 PM

## 2010-09-21 ENCOUNTER — Other Ambulatory Visit: Payer: Self-pay

## 2010-09-21 ENCOUNTER — Other Ambulatory Visit: Payer: Self-pay | Admitting: Internal Medicine

## 2010-09-21 ENCOUNTER — Encounter (INDEPENDENT_AMBULATORY_CARE_PROVIDER_SITE_OTHER): Payer: Self-pay | Admitting: *Deleted

## 2010-09-21 ENCOUNTER — Encounter: Payer: Self-pay | Admitting: Internal Medicine

## 2010-09-21 ENCOUNTER — Telehealth: Payer: Self-pay | Admitting: Internal Medicine

## 2010-09-21 DIAGNOSIS — E039 Hypothyroidism, unspecified: Secondary | ICD-10-CM

## 2010-09-21 DIAGNOSIS — E049 Nontoxic goiter, unspecified: Secondary | ICD-10-CM

## 2010-09-21 LAB — CONVERTED CEMR LAB
Thyroglobulin Ab: <20 (ref <40.0)
Thyroperoxidase Ab SerPl-aCnc: 115 — ABNORMAL HIGH (ref <35.0)

## 2010-09-24 LAB — T4, FREE: Free T4: 1 ng/dL (ref 0.60–1.60)

## 2010-09-24 LAB — TSH: TSH: 0.54 u[IU]/mL (ref 0.35–5.50)

## 2010-09-26 NOTE — Progress Notes (Addendum)
Summary: Lab Orders  Phone Note Call from Patient   Complaint: Abdominal Pain Summary of Call: pt reports she is going to see endocrinologist in [place], [place] re: mild goiter,  hx of right thyroid nodule please fax copies of blood tests and thyroid ultrasounds to Dr. Doristine Locks tel [phone] fax [phone]  please notify pt after above action completed Initial call taken by: D. Thomos Lemons DO,  September 21, 2010 1:45 PM  Follow-up for Phone Call        I suggest pt go to [facility] for thyroid blood tests before specialist visit.  see orders Follow-up by: D. Thomos Lemons DO,  September 21, 2010 1:47 PM  Additional Follow-up for Phone Call Additional follow up Details #1::        lab orders faxed to [facility] and Ultrasound and labs have been faxed to Dr Oneita Jolly Additional Follow-up by: Glendell Docker CMA,  September 21, 2010 3:16 PM    Additional Follow-up for Phone Call Additional follow up Details #2::    call placed to patient at [phone], patient has been advised per Dr Artist Pais instructions. She states her appointment is Monday. She was informed the [facility] lab was open until 5:30 today, and the labs were not fasting. She states she will try to have blood work done today. Follow-up by: Glendell Docker CMA,  September 21, 2010 3:37 PM

## 2010-12-04 ENCOUNTER — Telehealth: Payer: Self-pay | Admitting: Internal Medicine

## 2010-12-04 DIAGNOSIS — E039 Hypothyroidism, unspecified: Secondary | ICD-10-CM

## 2010-12-04 NOTE — Telephone Encounter (Signed)
Ok to refill x 5 Needs TSH within 6 months  244.9

## 2010-12-04 NOTE — Telephone Encounter (Signed)
Refill- synthroid tab. Take one tablet by mouth every day. Qty 90. Last fill 1.12.12

## 2010-12-05 MED ORDER — LEVOTHYROXINE SODIUM 88 MCG PO TABS: 88.0000 ug | ORAL_TABLET | Freq: Every day | ORAL | Status: DC

## 2010-12-05 NOTE — Telephone Encounter (Signed)
Call placed to patient at [phone], she was informed Rx refill for synthroid sent to pharmacy,and per Dr Artist Pais instructions on return of blood work. Patient verbalized understanding and agrees as instructed. Lab order entered for [facility] for October

## 2011-01-04 NOTE — Assessment & Plan Note (Signed)
[facility]                             PRIMARY CARE OFFICE NOTE   Becky Fuentes, Becky Fuentes                          MRN:          [id number]  DATE:03/31/2006                            DOB:          15-Aug-1966    CHIEF COMPLAINT:  New patient to practice.   HISTORY OF PRESENT ILLNESS:  The patient is a 45 year old [place] female here  to establish primary care.  She has moved down to this area from [place],  [place].  Since February of 2006, she has not established with a primary  care physician up to this point.  She has a history of thyroid disorder,  started when she was pregnancy with her first child.  She describes  transient hyperthyroidism and then persistent hypothyroidism after her  pregnancy.  She has been on Synthroid therapy for many years.  There was  also the question of possible goiter discovered approximately two to three  years ago.  The patient had a thyroid ultrasound at that time which was  unremarkable.  The patient is here today.  She has been concerned that her  neck/goiter has been enlarging over the last several months.  The patient  denies mild discomfort with swallowing but no overt dysphagia.  No symptoms  of hyper or hypothyroidism.  Her weight has been fairly stable.  No heat or  cold intolerance.  No constipation or diarrhea.  No complaints of brittle  hair or alopecia.  It has been approximately one to two years since her last  thyroid check.   PAST MEDICAL HISTORY:  1. Hypothyroidism.  2. History of ovarian cyst in October of 2005.  3. Breast biopsy 18 years ago on the right side, benign.   CURRENT MEDICATIONS:  Synthroid 100 mcg once a day.   ALLERGIES:  NO KNOWN DRUG ALLERGIES.   SOCIAL HISTORY:  The patient is married.  She works as a Biochemist, clinical of a Cytogeneticist.  She has two children.   FAMILY HISTORY:  Mother and father are both living.  Denies any family  history of coronary disease,  diabetes, or cancer.   HABITS:  No alcohol or tobacco use.   REVIEW OF SYSTEMS:  As noted above.  No chest pain, shortness of breath, no  chronic cough.  No heartburn, nausea, vomiting, constipation, diarrhea.  No  dysuria, frequency, urgency.  All other systems negative.   PHYSICAL EXAMINATION:  VITAL SIGNS:  Height 5 feet 3 inches, weight 116  pounds.  Temperature 100.3, pulse 72, blood pressure 102/69.  GENERAL:  The patient is a very pleasant, thin [place]-American female in no  apparent distress.  HEENT:  Normocephalic, atraumatic.  Pupils are equal, round, and reactive to  light bilaterally.  Extraocular motility was intact.  The patient was  anicteric.  Conjunctivae was within normal limits.  No evidence of  exophthalmus.  External auditory canal and tympanic membranes were clear  bilaterally.  Oropharynx was unremarkable.  NECK:  The patient did have some fullness of her lower neck; however, it is  unclear whether  this is just prominence of her cricoid cartilage versus  thyroid tissue.  RESPIRATORY:  Normal respiratory effort.  Clear to auscultation bilaterally.  No rales, rhonchi, or wheezes.  CARDIOVASCULAR:  Regular rate and rhythm.  No significant murmurs, rubs, or  gallops appreciated.  ABDOMEN:  Soft, nontender.  Positive bowel sounds.  No organomegaly.  MUSCULOSKELETAL:  No clubbing, cyanosis, or edema.  SKIN:  Warm and dry.  NEUROLOGIC:  Cranial nerves II-XII are grossly intact.  She was nonfocal.   IMPRESSION:  1. Possible goiter.  2. History of hypothyroidism.  3. Health maintenance.   RECOMMENDATIONS:  We will repeat her thyroid function studies today and  obtain a neck ultrasound.  She is to continue her Synthroid dose for now,  changes based upon repeat thyroid studies.   I discussed checking other screening labs such as her blood sugar and  cholesterol level.  This can be further discussed on followup visit, and the  patient was given a referral sheet for  obtaining routine screening mammogram  which the patient was told to schedule on her own.  Followup time will be in  approximately two to four weeks.                                   Barbette Hair. Artist Pais, DO   RDY/MedQ  DD:  03/31/2006  DT:  04/01/2006  Job #:  [id number]

## 2011-03-18 IMAGING — CR DG CHEST 2V
2 series · 2 of 2 positions shown · non-contrast
Comparison: None.

CLINICAL DATA: Chronic coughing.

CHEST - 2 VIEW

[w chest pa]
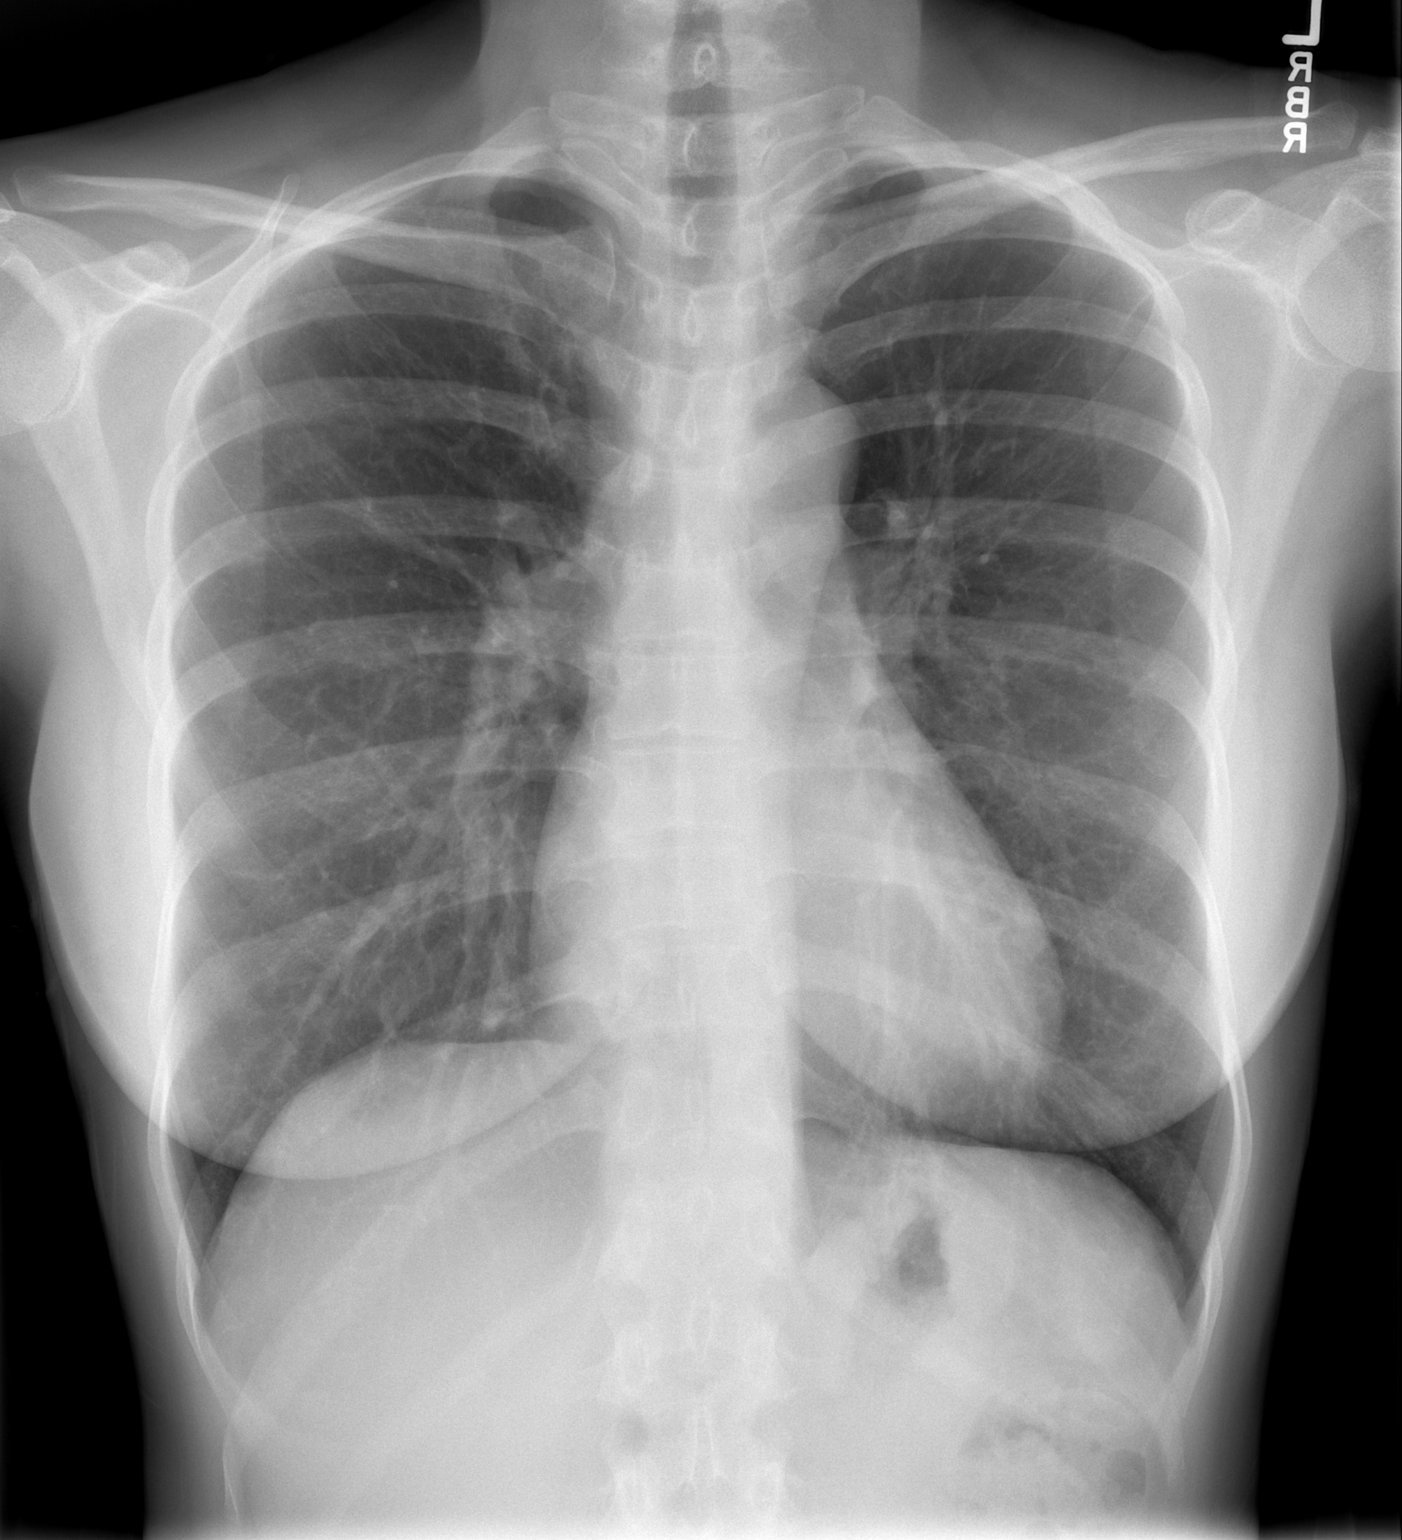

[w chest lat]
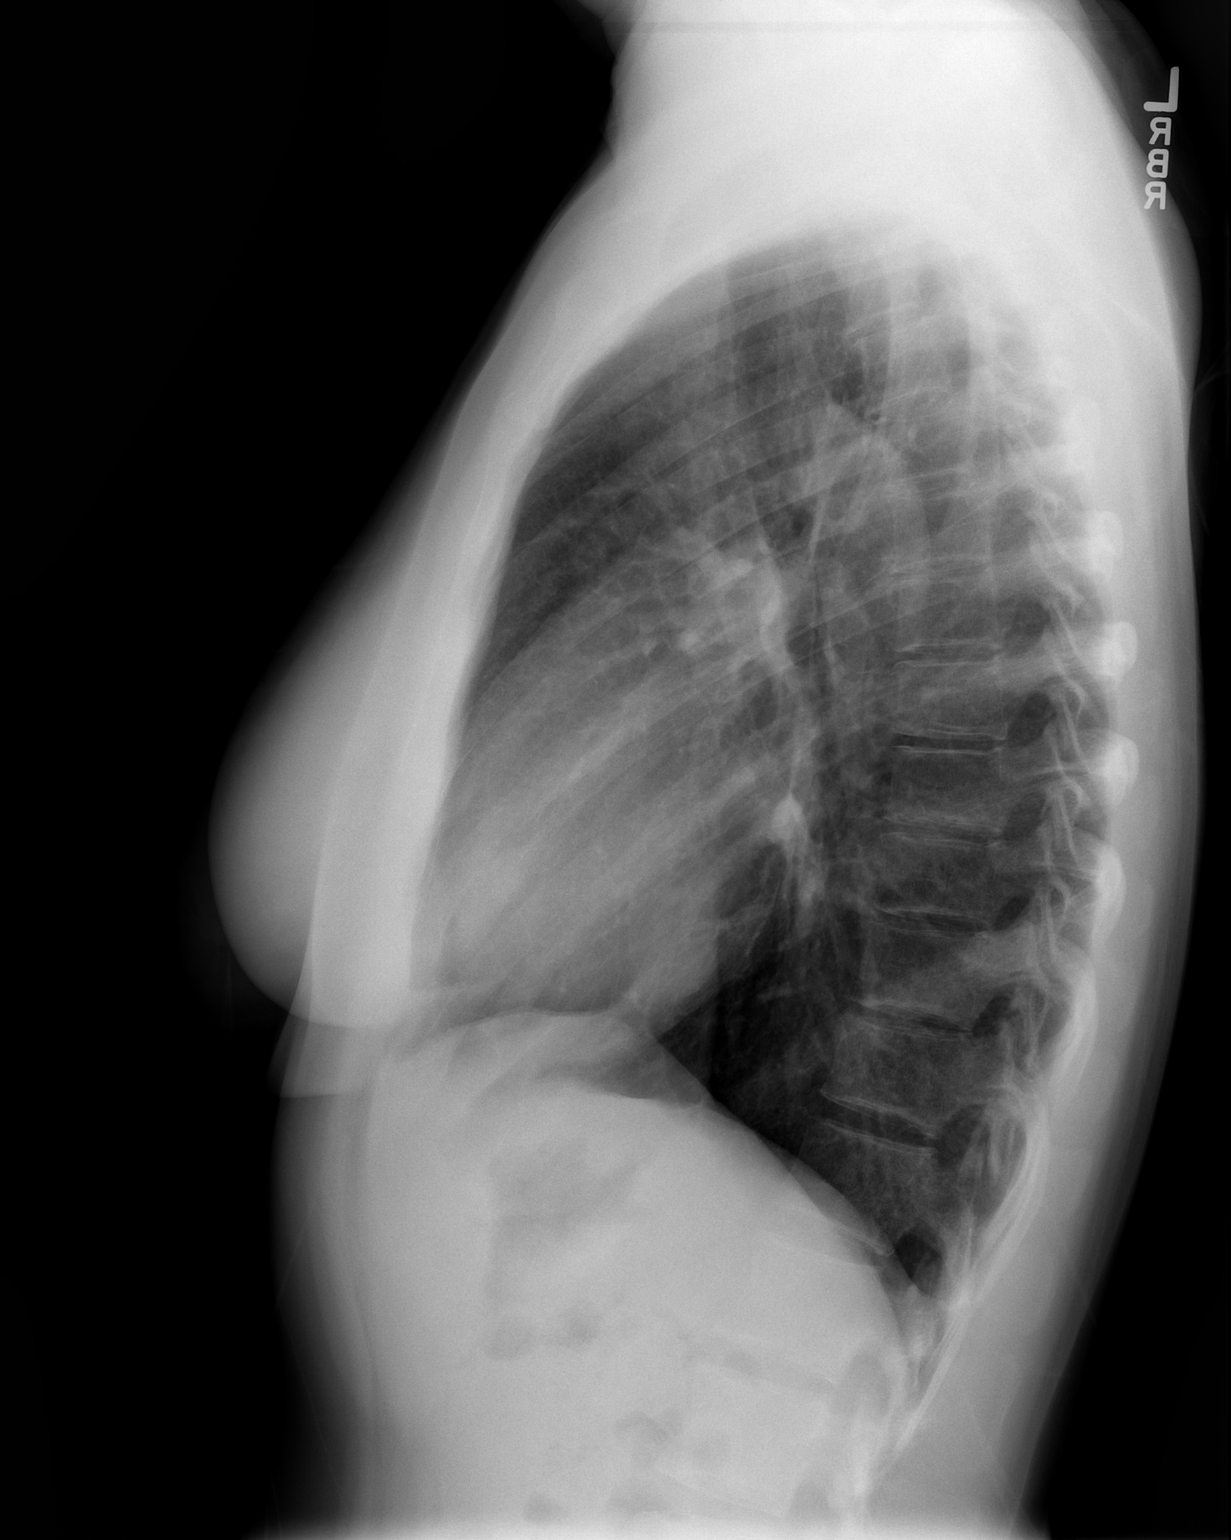

[2 of 2 positions shown; findings below may reference images not displayed]

FINDINGS: The cardiac silhouette is normal size and shape. No
pleural abnormality is evident. The lungs are well aerated and free
of infiltrates. Bones appear average for age.
IMPRESSION: No acute or active process is seen.

## 2011-07-18 ENCOUNTER — Encounter: Payer: Self-pay | Admitting: Internal Medicine

## 2011-07-19 ENCOUNTER — Ambulatory Visit (INDEPENDENT_AMBULATORY_CARE_PROVIDER_SITE_OTHER): Payer: BC Managed Care – PPO | Admitting: Internal Medicine

## 2011-07-19 ENCOUNTER — Encounter: Payer: Self-pay | Admitting: Internal Medicine

## 2011-07-19 DIAGNOSIS — E039 Hypothyroidism, unspecified: Secondary | ICD-10-CM

## 2011-07-19 DIAGNOSIS — D508 Other iron deficiency anemias: Secondary | ICD-10-CM

## 2011-07-19 DIAGNOSIS — Z131 Encounter for screening for diabetes mellitus: Secondary | ICD-10-CM

## 2011-07-19 NOTE — Assessment & Plan Note (Signed)
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 Monitor thyroid function studies. Copy her endocrinologist in [place], [place]. Lab Results  Component Value Date   TSH 0.54 09/21/2010

## 2011-07-19 NOTE — Progress Notes (Signed)
  Subjective:    Patient ID: Becky Fuentes, female    DOB: September 29, 1965, 45 y.o.   MRN: [id number]  HPI  45 year old [place] female with history of hypothyroidism and iron deficiency anemia for routine followup. No significant interval history. She was seen by endocrinologist in [facility].  Patient noted to have mild persistent anemia. She was unable to tolerate oral iron replacement due to GI side effects. It causes nausea and upset stomach.  Her iron deficiency is due to excessive menstruation. Patient reports her menstrual cycle has not changed. She is not perimenopausal.  Hypothyroidism - weight is stable.   Review of Systems She denies unusual fatigue    Past Medical History  Diagnosis Date  . Multinodular goiter   . Hypothyroidism     History   Social History  . Marital Status: Married    Spouse Name: N/A    Number of Children: N/A  . Years of Education: N/A   Occupational History  . Not on file.   Social History Main Topics  . Smoking status: Never Smoker   . Smokeless tobacco: Not on file  . Alcohol Use: No  . Drug Use: No  . Sexually Active:    Other Topics Concern  . Not on file   Social History Narrative  . No narrative on file    Past Surgical History  Procedure Date  . Breast biopsy   . Ovarian cyst surgery 10/05    Family History  Problem Relation Age of Onset  . Hyperlipidemia Neg Hx   . Hypertension Neg Hx   . Stroke Neg Hx   . Diabetes Neg Hx   . Heart disease Neg Hx   . Cancer Neg Hx     breast, colon, and prostate    No Known Allergies  Current Outpatient Prescriptions on File Prior to Visit  Medication Sig Dispense Refill  . levothyroxine (SYNTHROID) 88 MCG tablet Take 1 tablet (88 mcg total) by mouth daily.  90 tablet  1    BP 104/66  Pulse 87  Temp(Src) 98.4 F (36.9 C) (Oral)  Ht 5\' 4"  (1.626 m)  Wt 131 lb (59.421 kg)  BMI 22.49 kg/m2    Objective:   Physical Exam  Constitutional: She appears well-developed  and well-nourished.  HENT:  Head: Normocephalic and atraumatic.  Neck: Normal range of motion. Neck supple. No thyromegaly present.  Cardiovascular: Normal rate, regular rhythm and normal heart sounds.   Pulmonary/Chest: Effort normal and breath sounds normal.  Skin: Skin is warm and dry.       Assessment & Plan:

## 2011-07-19 NOTE — Assessment & Plan Note (Addendum)
Iron deficiency anemia presumed secondary to excessive menstruation. Monitor iron studies. She is unable to tolerate oral iron replacement. We discussed fruits and vegetables are high in iron.  Monitor iron studies.  Lab Results  Component Value Date   WBC 6.4 05/22/2010   HGB 11.2* 05/22/2010   HCT 37.3 05/22/2010   MCV 84.0 05/22/2010   PLT 367 05/22/2010

## 2011-07-19 NOTE — Patient Instructions (Signed)
Foods high in iron (spinach, dark leafy vegetables, raisins, beans, shell fish)

## 2011-07-22 ENCOUNTER — Telehealth: Payer: Self-pay | Admitting: *Deleted

## 2011-07-22 ENCOUNTER — Other Ambulatory Visit: Payer: BC Managed Care – PPO

## 2011-07-22 DIAGNOSIS — D508 Other iron deficiency anemias: Secondary | ICD-10-CM

## 2011-07-22 LAB — CBC WITH DIFFERENTIAL/PLATELET
Basophils Absolute: 0.1 10*3/uL (ref 0.0–0.1)
Eosinophils Relative: 2.8 % (ref 0.0–5.0)
HCT: 21.3 % — CL (ref 36.0–46.0)
Lymphs Abs: 1.5 10*3/uL (ref 0.7–4.0)
Monocytes Absolute: 0.6 10*3/uL (ref 0.1–1.0)
Monocytes Relative: 10.4 % (ref 3.0–12.0)
Neutrophils Relative %: 58.8 % (ref 43.0–77.0)
Platelets: 348 10*3/uL (ref 150.0–400.0)
RDW: 20.4 % — ABNORMAL HIGH (ref 11.5–14.6)
WBC: 5.5 10*3/uL (ref 4.5–10.5)

## 2011-07-22 NOTE — Telephone Encounter (Signed)
Call report on hgb 6.3.  Per Dr Artist Pais refer pt to hematology Dr Etta Quill

## 2011-07-22 NOTE — Progress Notes (Signed)
Addended by: Bonnye Fava on: 07/22/2011 02:54 PM   Modules accepted: Orders

## 2011-07-22 NOTE — Assessment & Plan Note (Signed)
45 y/o female with hx of iron def anemia secondary to heavy menstruations.  She has not been taking her oral iron due to GI upset.  Her recent Hg 6.3.  Refer to hematologist for IV iron.  She may need to reassessed by GYN for explore treatment options for fibroids.

## 2011-07-23 LAB — BASIC METABOLIC PANEL
CO2: 23 mEq/L (ref 19–32)
Chloride: 108 mEq/L (ref 96–112)
Glucose, Bld: 84 mg/dL (ref 70–99)
Potassium: 4.3 mEq/L (ref 3.5–5.1)
Sodium: 141 mEq/L (ref 135–145)

## 2011-07-23 LAB — HEPATIC FUNCTION PANEL
AST: 25 U/L (ref 0–37)
Total Bilirubin: 0.4 mg/dL (ref 0.3–1.2)

## 2011-07-23 LAB — IBC PANEL
Iron: 12 ug/dL — ABNORMAL LOW (ref 42–145)
Saturation Ratios: 2.4 % — ABNORMAL LOW (ref 20.0–50.0)
Transferrin: 353.7 mg/dL (ref 212.0–360.0)

## 2011-07-23 LAB — VITAMIN D 25 HYDROXY (VIT D DEFICIENCY, FRACTURES): Vit D, 25-Hydroxy: 35 ng/mL (ref 30–89)

## 2011-07-23 LAB — LIPID PANEL
HDL: 45.9 mg/dL (ref >39.00)
LDL Cholesterol: 100 mg/dL — ABNORMAL HIGH (ref 0–99)
Total CHOL/HDL Ratio: 4
VLDL: 20.2 mg/dL (ref 0.0–40.0)

## 2011-07-23 LAB — MAGNESIUM: Magnesium: 2.1 mg/dL (ref 1.5–2.5)

## 2011-07-23 LAB — FERRITIN: Ferritin: 2.3 ng/mL — ABNORMAL LOW (ref 10.0–291.0)

## 2011-07-23 NOTE — Telephone Encounter (Signed)
Dr Artist Pais contacted pt to explain everything.  (pt does not speak good english).  Order placed for referral

## 2011-07-24 ENCOUNTER — Telehealth: Payer: Self-pay | Admitting: Oncology

## 2011-07-24 ENCOUNTER — Telehealth: Payer: Self-pay | Admitting: Internal Medicine

## 2011-07-24 NOTE — Telephone Encounter (Signed)
S/w pt today re appt for 12/7 @ 10:30 am.

## 2011-07-24 NOTE — Telephone Encounter (Signed)
Call pt - please have pt pick up set of hemoccult cards and complete within 1 week. Use iron def anemia as diagnosis code

## 2011-07-25 ENCOUNTER — Other Ambulatory Visit: Payer: Self-pay | Admitting: Oncology

## 2011-07-25 DIAGNOSIS — D649 Anemia, unspecified: Secondary | ICD-10-CM

## 2011-07-26 ENCOUNTER — Ambulatory Visit: Payer: BC Managed Care – PPO

## 2011-07-26 ENCOUNTER — Telehealth: Payer: Self-pay | Admitting: Oncology

## 2011-07-26 ENCOUNTER — Ambulatory Visit (HOSPITAL_BASED_OUTPATIENT_CLINIC_OR_DEPARTMENT_OTHER): Payer: BC Managed Care – PPO | Admitting: Oncology

## 2011-07-26 ENCOUNTER — Ambulatory Visit (HOSPITAL_BASED_OUTPATIENT_CLINIC_OR_DEPARTMENT_OTHER): Payer: BC Managed Care – PPO

## 2011-07-26 ENCOUNTER — Other Ambulatory Visit (HOSPITAL_BASED_OUTPATIENT_CLINIC_OR_DEPARTMENT_OTHER): Payer: BC Managed Care – PPO | Admitting: Lab

## 2011-07-26 VITALS — BP 114/73 | HR 99 | Temp 98.0°F | Ht 64.0 in | Wt 129.5 lb

## 2011-07-26 VITALS — BP 101/71 | HR 89 | Temp 98.2°F

## 2011-07-26 DIAGNOSIS — D508 Other iron deficiency anemias: Secondary | ICD-10-CM

## 2011-07-26 DIAGNOSIS — D649 Anemia, unspecified: Secondary | ICD-10-CM

## 2011-07-26 DIAGNOSIS — D509 Iron deficiency anemia, unspecified: Secondary | ICD-10-CM

## 2011-07-26 DIAGNOSIS — N920 Excessive and frequent menstruation with regular cycle: Secondary | ICD-10-CM

## 2011-07-26 DIAGNOSIS — E039 Hypothyroidism, unspecified: Secondary | ICD-10-CM

## 2011-07-26 MED ORDER — SODIUM CHLORIDE 0.9 % IV SOLN
1020.0000 mg | Freq: Once | INTRAVENOUS | Status: AC
  Administered 2011-07-26: 1020 mg via INTRAVENOUS
  Filled 2011-07-26: qty 34

## 2011-07-26 MED ORDER — SODIUM CHLORIDE 0.9 % IV SOLN
Freq: Once | INTRAVENOUS | Status: AC
  Administered 2011-07-26: 12:00:00 via INTRAVENOUS

## 2011-07-26 NOTE — Telephone Encounter (Signed)
gv pt appt schedule for march °

## 2011-07-26 NOTE — Progress Notes (Signed)
CC:   Becky Fuentes. Becky Pais, DO  REASON FOR CONSULTATION:  Anemia.  HISTORY OF PRESENT ILLNESS:  Becky Fuentes is a very pleasant 45 year old [place] woman, currently of [place].  She is a very pleasant woman with history of hypothyroidism, as well as chronic iron-deficiency anemia related to heavy menstrual cycles and uterine fibroids.  She gets her routine accurate, as mentioned under the care of Dr. Artist Fuentes and had been again diagnosed with iron deficiency in the past and had been prescribed iron replacement, but reported poor tolerance to it and she had not been really taking it regularly.  Her laboratory data dating back to June 2010 showed that hemoglobin had fluctuated between 9 and 11.  On May 22, 2010, it was 11.2, however, most recently on July 22, 2011, she was noted to have a hemoglobin of 6.3.  Her white cell count was 5.5, platelet count 348, and her MCV was 60, RDW was 20.4. Her most recent iron studies on December 3rd showed iron level of 12, ferritin of 2.3, and iron saturation of 2.4, indicating severe iron- deficiency anemia.  The patient was referred to me for evaluation for possible iron infusion.  Upon interviewing Becky Fuentes, she is quite fatigued at this time, but again as mentioned, she does have a history of hypothyroidism, but she had not reported any chest pain, did not report any shortness of breath, did not report any difficulty breathing. Had not reported any hematochezia, had not reported any melena, had not reported any blood loss.  She does report heavy menstrual cycles, especially for the last 5 years.  REVIEW OF SYSTEMS:  She does not report any headaches, blurry vision, double vision.  Did not report any motor or sensory neuropathy.  Did not report any alteration in mental status.  Did not report any psychiatric issues or depression.  Did not report any fever, chills, sweats.  No cough, hemoptysis, hematemesis.  No nausea or vomiting.  No abdominal pain.   Rest of review of systems unremarkable.  PAST MEDICAL HISTORY:  Significant for anemia, history of uterine fibroids, also hypothyroidism.  SOCIAL HISTORY:  She is married.  She denied any alcohol or tobacco use. She has 2 children.  PAST SURGICAL HISTORY:  She had a breast biopsy and ovarian cysts.  FAMILY HISTORY:  Really unremarkable for any malignancy or any blood disorders or hemoglobinopathies.  ALLERGIES:  None.  MEDICATIONS:  She is on Synthroid.  PHYSICAL EXAMINATION:  General:  Alert, awake female, does not really appear in any active distress.  Vital Signs:  Her blood pressure is 114/73, pulse is 99, respirations 20, she is afebrile.  HEENT:  Head is normocephalic, atraumatic.  Pupils equal, round, reactive to light. Oral mucosa moist and pink.  Neck:  Supple without lymphadenopathy. Heart:  Regular rate and rhythm, S1 and S2.  Lungs:  Clear to auscultation without rhonchi, wheezes, or dullness to percussion. Abdomen:  Soft, nontender.  No hepatosplenomegaly.  Extremities:  No clubbing, cyanosis, or edema.  Neurologically:  Intact motor, sensory, and deep tendon reflexes.  ASSESSMENT AND PLAN:  This is a pleasant 45 year old female with the following issues: 1. Microcytic hypochromic anemia.  Differential diagnosis includes     severe iron deficiency which is probably related to heavy menstrual     cycles.  She does have a uterine fibroid that has been documented     and she had been recommended to have a hysterectomy.  Other     possibility would include hemoglobinopathy,  given the fact that she     is from a [place] decent and she certainly could be     susceptible to a thalassemia.  In terms of management, I will     obtain a hemoglobin electrophoresis today.  I also talked to her     about an IV iron infusion.  Different formulations were discussed     today.  We talked about Feraheme, a total of 1 g given today as a     potential therapy.  Risks and  benefits were discussed today.     Toxicities including infusion-related reaction, arthralgias,     myalgias, anaphylaxis were all discussed and she is agreeable to     proceed.  I will recheck her blood counts and iron studies in about     3 months. 2. Heavy menstrual cycles, probably related to uterine fibroids.     Again, have recommended that she consider seriously having a     hysterectomy if that is the recommendation by her OB/GYN. 3. Hypothyroidism, followed by Dr. Artist Fuentes, as well as an endocrinologist     Becky Fuentes.    ______________________________ Benjiman Core, M.D. FNS/MEDQ  D:  07/26/2011  T:  07/26/2011  Job:  [id number]

## 2011-07-26 NOTE — Progress Notes (Signed)
@  1350--Tolerated Feraheme infusion without adverse event. D/C home in good condition, ambulatory. Aware of next appointment in March.

## 2011-07-26 NOTE — Telephone Encounter (Signed)
Pt will be here on Monday to p/u cards

## 2011-07-26 NOTE — Progress Notes (Signed)
Note dictated

## 2011-07-30 LAB — HEMOGLOBINOPATHY EVALUATION: Hgb S Quant: 0 %

## 2011-10-24 ENCOUNTER — Other Ambulatory Visit: Payer: BC Managed Care – PPO | Admitting: Lab

## 2011-10-24 ENCOUNTER — Ambulatory Visit: Payer: BC Managed Care – PPO | Admitting: Oncology

## 2011-10-28 ENCOUNTER — Encounter (HOSPITAL_COMMUNITY): Payer: Self-pay

## 2011-10-29 ENCOUNTER — Other Ambulatory Visit: Payer: Self-pay | Admitting: Obstetrics

## 2011-10-31 ENCOUNTER — Encounter (HOSPITAL_COMMUNITY): Payer: Self-pay

## 2011-10-31 ENCOUNTER — Encounter (HOSPITAL_COMMUNITY)
Admission: RE | Admit: 2011-10-31 | Discharge: 2011-10-31 | Disposition: A | Discharge status: Home / Self Care | Payer: BC Managed Care – PPO | Source: Ambulatory Visit | Attending: Obstetrics | Admitting: Obstetrics

## 2011-10-31 DIAGNOSIS — Z01818 Encounter for other preprocedural examination: Secondary | ICD-10-CM | POA: Insufficient documentation

## 2011-10-31 HISTORY — DX: Anemia, unspecified: D64.9

## 2011-10-31 LAB — CBC
HCT: 36.7 % (ref 36.0–46.0)
Hemoglobin: 11.9 g/dL — ABNORMAL LOW (ref 12.0–15.0)
MCH: 29.2 pg (ref 26.0–34.0)
MCHC: 32.4 g/dL (ref 30.0–36.0)
RBC: 4.08 MIL/uL (ref 3.87–5.11)

## 2011-10-31 NOTE — Patient Instructions (Addendum)
   Your procedure is scheduled VH:QIONGE, March 22  Enter through the Main Entrance of [facility] at: 6:00am Pick up the phone at the desk and dial [phone] and inform us of your arrival.  Please call this number if you have any problems the morning of surgery: [phone]  Remember: Do not eat food after midnight: Thursday Do not drink clear liquids after: Thursday  Do not wear jewelry, make-up, or FINGER nail polish Do not wear lotions, powders, perfumes or deodorant. Do not shave 48 hours prior to surgery. Do not bring valuables to the hospital. Contacts, dentures or bridgework may not be worn into surgery.  Patients discharged on the day of surgery will not be allowed to drive home.    Remember to use your hibiclens as instructed.Please shower with 1/2 bottle the evening before your surgery and the other 1/2 bottle the morning of surgery. Neck down avoiding private area.

## 2011-10-31 NOTE — Progress Notes (Signed)
Un Becky Fuentes, interpretor was with patient during all of her pre op appointment.  All questions were answered.  Patient verbalized understanding of what to expect the day of her surgery and her instructions.

## 2011-11-08 ENCOUNTER — Encounter (HOSPITAL_COMMUNITY): Admission: RE | Payer: Self-pay | Source: Ambulatory Visit

## 2011-11-08 ENCOUNTER — Ambulatory Visit (HOSPITAL_COMMUNITY): Admission: RE | Admit: 2011-11-08 | Payer: BC Managed Care – PPO | Source: Ambulatory Visit | Admitting: Obstetrics

## 2011-11-08 SURGERY — DILATATION & CURETTAGE/HYSTEROSCOPY WITH RESECTOCOPE
Anesthesia: Choice

## 2012-01-30 ENCOUNTER — Encounter: Payer: Self-pay | Admitting: Internal Medicine

## 2012-01-30 ENCOUNTER — Ambulatory Visit (INDEPENDENT_AMBULATORY_CARE_PROVIDER_SITE_OTHER): Payer: BC Managed Care – PPO | Admitting: Internal Medicine

## 2012-01-30 VITALS — BP 104/72 | HR 84 | Temp 98.1°F | Wt 124.0 lb

## 2012-01-30 DIAGNOSIS — D508 Other iron deficiency anemias: Secondary | ICD-10-CM

## 2012-01-30 DIAGNOSIS — E039 Hypothyroidism, unspecified: Secondary | ICD-10-CM

## 2012-01-30 LAB — CBC WITH DIFFERENTIAL/PLATELET
Basophils Absolute: 0 10*3/uL (ref 0.0–0.1)
Eosinophils Absolute: 0.1 10*3/uL (ref 0.0–0.7)
HCT: 35.7 % — ABNORMAL LOW (ref 36.0–46.0)
Hemoglobin: 11.8 g/dL — ABNORMAL LOW (ref 12.0–15.0)
Lymphs Abs: 1.8 10*3/uL (ref 0.7–4.0)
MCHC: 33.1 g/dL (ref 30.0–36.0)
Neutro Abs: 4.3 10*3/uL (ref 1.4–7.7)
Platelets: 311 10*3/uL (ref 150.0–400.0)
RDW: 14.3 % (ref 11.5–14.6)

## 2012-01-30 LAB — T4, FREE: Free T4: 0.85 ng/dL (ref 0.60–1.60)

## 2012-01-30 LAB — TSH: TSH: 2.59 u[IU]/mL (ref 0.35–5.50)

## 2012-01-30 LAB — IBC PANEL: Iron: 63 ug/dL (ref 42–145)

## 2012-01-30 NOTE — Assessment & Plan Note (Signed)
Improved with IV iron. She is also having sporadic menstrual cycles (perimenopausal). She has history of uterine fibroids. She is considering fibroid surgery however this may not be necessary. Continue oral iron supplements for now. Monitor iron level and CBC.

## 2012-01-30 NOTE — Progress Notes (Signed)
  Subjective:    Patient ID: Becky Fuentes, female    DOB: [birth year], 46 y.o.   MRN: [id number]  HPI   46 year old Asian female with history of autoimmune hypothyroidism and iron deficiency anemia for routine followup. Since previous visit patient was seen by hematologist and was given iron IV infusion.   Patient reports having mild allergic reaction to iron. However it did improve her anemia significantly.   IV iron infusion was also cost prohibitive.   Her menstrual bleeding has decreased. She may be perimenopausal. She occasionally skips menstrual cycle.   She is known to have uterine fibroids. She is considering fibroid surgery.  She is traveling to [place] this summer and may have procedure overseas.  Review of Systems Negative for fatigue,  Mild weight loss  Past Medical History  Diagnosis Date  . Multinodular goiter   . Hypothyroidism   . Anemia     History   Social History  . Marital Status: Married    Spouse Name: N/A    Number of Children: N/A  . Years of Education: N/A   Occupational History  . Not on file.   Social History Main Topics  . Smoking status: Never Smoker   . Smokeless tobacco: Not on file  . Alcohol Use: No  . Drug Use: No  . Sexually Active: Yes    Birth Control/ Protection: IUD   Other Topics Concern  . Not on file   Social History Narrative  . No narrative on file    Past Surgical History  Procedure Date  . Breast biopsy   . Ovarian cyst surgery 10/05    Family History  Problem Relation Age of Onset  . Hyperlipidemia Neg Hx   . Hypertension Neg Hx   . Stroke Neg Hx   . Diabetes Neg Hx   . Heart disease Neg Hx   . Cancer Neg Hx     breast, colon, and prostate    No Known Allergies  Current Outpatient Prescriptions on File Prior to Visit  Medication Sig Dispense Refill  . Multiple Vitamin (MULITIVITAMIN WITH MINERALS) TABS Take 1 tablet by mouth daily.      . polysaccharide iron (NIFEREX) 150 MG CAPS capsule Take 150 mg by  mouth daily.      Marland Kitchen DISCONTD: levothyroxine (SYNTHROID) 88 MCG tablet Take 1 tablet (88 mcg total) by mouth daily.  90 tablet  1    BP 104/72  Pulse 84  Temp 98.1 F (36.7 C) (Oral)  Wt 124 lb (56.246 kg)       Objective:   Physical Exam  Constitutional: She appears well-developed and well-nourished.  Neck: Neck supple. No thyromegaly present.  Cardiovascular: Normal rate, regular rhythm and normal heart sounds.   Pulmonary/Chest: Breath sounds normal. She has no wheezes. She has no rales.       Assessment & Plan:

## 2012-01-30 NOTE — Patient Instructions (Addendum)
Please complete the following lab tests before your next follow up appointment:  TSH - 244.9 CBCD - 280.8

## 2012-01-30 NOTE — Assessment & Plan Note (Signed)
Mild weight loss.  Monitor TFTs.  Adjust levothyroxine dose accordingly.

## 2012-02-03 ENCOUNTER — Other Ambulatory Visit: Payer: Self-pay | Admitting: Internal Medicine

## 2012-02-11 HISTORY — PX: UTERINE FIBROID SURGERY: SHX826

## 2012-07-21 ENCOUNTER — Other Ambulatory Visit (INDEPENDENT_AMBULATORY_CARE_PROVIDER_SITE_OTHER): Payer: BC Managed Care – PPO

## 2012-07-21 DIAGNOSIS — E039 Hypothyroidism, unspecified: Secondary | ICD-10-CM

## 2012-07-21 DIAGNOSIS — D508 Other iron deficiency anemias: Secondary | ICD-10-CM

## 2012-07-22 LAB — CBC WITH DIFFERENTIAL/PLATELET
Basophils Absolute: 0.1 10*3/uL (ref 0.0–0.1)
Eosinophils Absolute: 0.2 10*3/uL (ref 0.0–0.7)
HCT: 35 % — ABNORMAL LOW (ref 36.0–46.0)
Hemoglobin: 11.5 g/dL — ABNORMAL LOW (ref 12.0–15.0)
Lymphs Abs: 1.7 10*3/uL (ref 0.7–4.0)
MCHC: 32.8 g/dL (ref 30.0–36.0)
MCV: 88.5 fl (ref 78.0–100.0)
Monocytes Absolute: 0.6 10*3/uL (ref 0.1–1.0)
Neutro Abs: 3.8 10*3/uL (ref 1.4–7.7)
Platelets: 301 10*3/uL (ref 150.0–400.0)
RDW: 14.9 % — ABNORMAL HIGH (ref 11.5–14.6)

## 2012-07-27 ENCOUNTER — Ambulatory Visit (INDEPENDENT_AMBULATORY_CARE_PROVIDER_SITE_OTHER): Payer: BC Managed Care – PPO | Admitting: Internal Medicine

## 2012-07-27 ENCOUNTER — Encounter: Payer: Self-pay | Admitting: Internal Medicine

## 2012-07-27 VITALS — BP 124/80 | HR 97 | Temp 98.1°F | Wt 127.0 lb

## 2012-07-27 DIAGNOSIS — D508 Other iron deficiency anemias: Secondary | ICD-10-CM

## 2012-07-27 DIAGNOSIS — E039 Hypothyroidism, unspecified: Secondary | ICD-10-CM

## 2012-07-27 DIAGNOSIS — E042 Nontoxic multinodular goiter: Secondary | ICD-10-CM | POA: Insufficient documentation

## 2012-07-27 MED ORDER — LEVOTHYROXINE SODIUM 88 MCG PO TABS: 88.0000 ug | ORAL_TABLET | Freq: Every day | ORAL | Status: DC

## 2012-07-27 NOTE — Assessment & Plan Note (Signed)
Patient has history of multiple thyroid nodules. Her last 3 thyroid ultrasounds have been completely normal.  Her last ultrasound was completed in [place] in June, 2013. She is worried about risk of thyroid cancer. I suggest we repeat her thyroid ultrasound 2 years.

## 2012-07-27 NOTE — Assessment & Plan Note (Signed)
Patient underwent fibroid surgery on 02/11/2012. Her menstrual cycles are sporadic. She has much less bleeding. Her hemoglobin is stable. Lab Results  Component Value Date   WBC 6.4 07/21/2012   HGB 11.5* 07/21/2012   HCT 35.0* 07/21/2012   MCV 88.5 07/21/2012   PLT 301.0 07/21/2012

## 2012-07-27 NOTE — Progress Notes (Signed)
  Subjective:    Patient ID: Becky Fuentes, female    DOB: [birth year], 46 y.o.   MRN: [id number]  HPI  46 y/o [place] female with history of hypothyroidism and iron deficiency anemia for routine followup. Interval medical history-patient has fibroid surgery on 02/11/2012 while she was visiting family in [place]. Patient reports that she did not have menstrual cycle for 3 months then resumed having menstrual cycles. However her heavy bleeding has significantly improved.  She thinks she is perimenopausal  Hypothyroidism-her thyroid function studies are within normal range. She is currently taking 88 mcg of levothyroxine. She has history of thyroid nodules but her 3 previous thyroid ultrasounds have been normal. She still worries about getting thyroid cancer.  Patient also concerned about her blood sugars. She sometimes has symptoms of possible hypoglycemia. She reports getting shaky and not able to wait to eat.  Review of Systems No change in weight  Past Medical History  Diagnosis Date  . Multinodular goiter   . Hypothyroidism   . Anemia     History   Social History  . Marital Status: Married    Spouse Name: N/A    Number of Children: N/A  . Years of Education: N/A   Occupational History  . Not on file.   Social History Main Topics  . Smoking status: Never Smoker   . Smokeless tobacco: Not on file  . Alcohol Use: No  . Drug Use: No  . Sexually Active: Yes    Birth Control/ Protection: IUD   Other Topics Concern  . Not on file   Social History Narrative  . No narrative on file    Past Surgical History  Procedure Date  . Breast biopsy   . Ovarian cyst surgery 10/05  . Uterine fibroid surgery 02/11/2012    Surgery completed in [place]    Family History  Problem Relation Age of Onset  . Hyperlipidemia Neg Hx   . Hypertension Neg Hx   . Stroke Neg Hx   . Diabetes Neg Hx   . Heart disease Neg Hx   . Cancer Neg Hx     breast, colon, and prostate    No Known  Allergies  Current Outpatient Prescriptions on File Prior to Visit  Medication Sig Dispense Refill  . Multiple Vitamin (MULITIVITAMIN WITH MINERALS) TABS Take 1 tablet by mouth daily.      . [DISCONTINUED] levothyroxine (SYNTHROID, LEVOTHROID) 88 MCG tablet TAKE ONE TABLET BY MOUTH EVERY DAY  90 tablet  1  . [DISCONTINUED] levothyroxine (SYNTHROID, LEVOTHROID) 88 MCG tablet Take 88 mcg by mouth daily.        BP 124/80  Pulse 97  Temp 98.1 F (36.7 C) (Oral)  Wt 127 lb (57.607 kg)       Objective:   Physical Exam  Constitutional: She is oriented to person, place, and time. She appears well-developed and well-nourished.  Neck: Neck supple. No thyromegaly present.  Cardiovascular: Normal rate and regular rhythm.   Pulmonary/Chest: Effort normal. She has no wheezes.  Neurological: She is alert and oriented to person, place, and time.  Psychiatric: Her behavior is normal.          Assessment & Plan:

## 2012-07-27 NOTE — Patient Instructions (Addendum)
Please complete the following lab tests in 6 months: TSH - 244.9 CBCD - 285.9 BMET, A1c - 790.29

## 2012-07-27 NOTE — Assessment & Plan Note (Signed)
Her thyroid function studies are stable. Continued 88 mcg of levothyroxine. Repeat TSH in 6 months.  Lab Results  Component Value Date   TSH 0.91 07/21/2012

## 2012-12-11 ENCOUNTER — Other Ambulatory Visit: Payer: Self-pay | Admitting: *Deleted

## 2012-12-11 MED ORDER — LEVOTHYROXINE SODIUM 88 MCG PO TABS: 88.0000 ug | ORAL_TABLET | Freq: Every day | ORAL | Status: DC

## 2013-01-20 ENCOUNTER — Other Ambulatory Visit (INDEPENDENT_AMBULATORY_CARE_PROVIDER_SITE_OTHER): Payer: BC Managed Care – PPO

## 2013-01-20 DIAGNOSIS — E039 Hypothyroidism, unspecified: Secondary | ICD-10-CM

## 2013-01-20 DIAGNOSIS — D649 Anemia, unspecified: Secondary | ICD-10-CM

## 2013-01-21 LAB — CBC WITH DIFFERENTIAL/PLATELET
Basophils Relative: 0.4 % (ref 0.0–3.0)
Eosinophils Absolute: 0.2 10*3/uL (ref 0.0–0.7)
Hemoglobin: 11.1 g/dL — ABNORMAL LOW (ref 12.0–15.0)
Lymphocytes Relative: 30 % (ref 12.0–46.0)
MCHC: 32.5 g/dL (ref 30.0–36.0)
MCV: 81.9 fl (ref 78.0–100.0)
Neutro Abs: 4.1 10*3/uL (ref 1.4–7.7)
RBC: 4.18 Mil/uL (ref 3.87–5.11)

## 2013-01-21 LAB — BASIC METABOLIC PANEL
BUN: 12 mg/dL (ref 6–23)
Chloride: 107 mEq/L (ref 96–112)
Creatinine, Ser: 0.5 mg/dL (ref 0.4–1.2)

## 2013-01-21 LAB — HEMOGLOBIN A1C: Hgb A1c MFr Bld: 6.4 % (ref 4.6–6.5)

## 2013-01-28 ENCOUNTER — Ambulatory Visit (INDEPENDENT_AMBULATORY_CARE_PROVIDER_SITE_OTHER): Payer: BC Managed Care – PPO | Admitting: Internal Medicine

## 2013-01-28 ENCOUNTER — Encounter: Payer: Self-pay | Admitting: Internal Medicine

## 2013-01-28 VITALS — BP 110/78 | HR 84 | Temp 97.9°F | Resp 14 | Ht 64.0 in | Wt 126.0 lb

## 2013-01-28 DIAGNOSIS — E039 Hypothyroidism, unspecified: Secondary | ICD-10-CM

## 2013-01-28 DIAGNOSIS — R7309 Other abnormal glucose: Secondary | ICD-10-CM

## 2013-01-28 DIAGNOSIS — D508 Other iron deficiency anemias: Secondary | ICD-10-CM

## 2013-01-28 MED ORDER — POLYSACCHARIDE IRON COMPLEX 150 MG PO CAPS: 150.0000 mg | ORAL_CAPSULE | Freq: Two times a day (BID) | ORAL | Status: DC

## 2013-01-28 MED ORDER — DICLOFENAC SODIUM 1 % TD GEL: 2.0000 g | Freq: Three times a day (TID) | TRANSDERMAL | Status: DC

## 2013-01-28 MED ORDER — LEVOTHYROXINE SODIUM 88 MCG PO TABS: 88.0000 ug | ORAL_TABLET | Freq: Every day | ORAL | Status: DC

## 2013-01-28 NOTE — Assessment & Plan Note (Signed)
Patient is euthyroid. Maintain same dose of levothyroxine. Plan to repeat TSH in 6 months.

## 2013-01-28 NOTE — Assessment & Plan Note (Signed)
If patient's iron deficiency anemia slightly worse. Patient does not consume much red meat. Patient advised to resume previous prescription iron supplement as prescribed by her endocrinologist [place]. She could not tolerate over-the-counter iron.

## 2013-01-28 NOTE — Progress Notes (Signed)
  Subjective:    Patient ID: Becky Fuentes, female    DOB: 09-13-1965, 47 y.o.   MRN: [id number]  HPI  47 year old Asian female with history of hypothyroidism and iron deficiency anemia for followup. Patient reports she hasn't had a menstrual cycle in 6 months. She had light bleeding recently. She stopped her iron supplementation 6 months ago. She had difficulty tolerating over-the-counter iron.  Her anemia is slightly worse.   At previous visit patient is also concerned about her blood sugars. She was experiencing symptoms of possible hypoglycemia. She denies any family history of type 2 diabetes. Her A1c was elevated at 6.4. Her blood sugar was nonfasting - 119  She complains of hand stiffness.  She works in [employer].   Lab Results  Component Value Date   ANA NEG 12/05/2006   Lab Results  Component Value Date   RF < 20.0 IU/mL* 12/05/2006     Review of Systems Negative for fatigue.  Mild alopecia  Past Medical History  Diagnosis Date  . Multinodular goiter   . Hypothyroidism   . Anemia     History   Social History  . Marital Status: Married    Spouse Name: N/A    Number of Children: N/A  . Years of Education: N/A   Occupational History  . Not on file.   Social History Main Topics  . Smoking status: Never Smoker   . Smokeless tobacco: Not on file  . Alcohol Use: No  . Drug Use: No  . Sexually Active: Yes    Birth Control/ Protection: IUD   Other Topics Concern  . Not on file   Social History Narrative  . No narrative on file    Past Surgical History  Procedure Laterality Date  . Breast biopsy    . Ovarian cyst surgery  10/05  . Uterine fibroid surgery  02/11/2012    Surgery completed in [place]    Family History  Problem Relation Age of Onset  . Hyperlipidemia Neg Hx   . Hypertension Neg Hx   . Stroke Neg Hx   . Diabetes Neg Hx   . Heart disease Neg Hx   . Cancer Neg Hx     breast, colon, and prostate    No Known  Allergies  Current Outpatient Prescriptions on File Prior to Visit  Medication Sig Dispense Refill  . Cholecalciferol (VITAMIN D3) 2000 UNITS capsule Take 1 capsule (2,000 Units total) by mouth daily.      . Multiple Vitamin (MULITIVITAMIN WITH MINERALS) TABS Take 1 tablet by mouth daily.       No current facility-administered medications on file prior to visit.    BP 110/78  Pulse 84  Temp(Src) 97.9 F (36.6 C)  Resp 14  Ht 5\' 4"  (1.626 m)  Wt 126 lb (57.153 kg)  BMI 21.62 kg/m2       Objective:   Physical Exam  Constitutional: She is oriented to person, place, and time. She appears well-developed and well-nourished.  Neck: Neck supple. No thyromegaly present.  Cardiovascular: Normal rate, regular rhythm and normal heart sounds.   No murmur heard. Pulmonary/Chest: Effort normal and breath sounds normal. She has no wheezes.  Neurological: She is alert and oriented to person, place, and time. No cranial nerve deficit.  Psychiatric: She has a normal mood and affect. Her behavior is normal.          Assessment & Plan:

## 2013-01-28 NOTE — Patient Instructions (Addendum)
Please complete the following lab tests in 3 months. BMET, A1c, FLP - 790.29 CBC - 280.8

## 2013-01-28 NOTE — Assessment & Plan Note (Signed)
Patient unexpectedly has abnormal A1c. Patient understands to reduce her carbohydrate intake and avoid sweets. If A1c worsens, we discussed starting metformin.

## 2013-05-05 ENCOUNTER — Telehealth: Payer: Self-pay | Admitting: *Deleted

## 2013-05-05 NOTE — Telephone Encounter (Signed)
plz call pt and inform her of change.  Only change to [facility] if pt wants to make change.

## 2013-05-05 NOTE — Telephone Encounter (Signed)
I spoke with the [facility] and they said that the cost of Levothyroxine went up about a year ago.  They do have a savings card at the pharmacy and with a prescription the cost would be between $15-$20.  Okay to fill?

## 2013-05-06 NOTE — Telephone Encounter (Signed)
Left message on machine for patient to return our call 

## 2013-05-10 NOTE — Telephone Encounter (Signed)
Left message with female for patient to give [place] a call back

## 2013-05-12 NOTE — Telephone Encounter (Signed)
Spoke with son and patient to call back

## 2013-10-13 ENCOUNTER — Ambulatory Visit (INDEPENDENT_AMBULATORY_CARE_PROVIDER_SITE_OTHER): Payer: BC Managed Care – PPO | Admitting: Internal Medicine

## 2013-10-13 ENCOUNTER — Encounter: Payer: Self-pay | Admitting: Internal Medicine

## 2013-10-13 VITALS — BP 112/64 | HR 84 | Temp 98.6°F | Ht 64.0 in | Wt 116.0 lb

## 2013-10-13 DIAGNOSIS — D508 Other iron deficiency anemias: Secondary | ICD-10-CM

## 2013-10-13 DIAGNOSIS — E039 Hypothyroidism, unspecified: Secondary | ICD-10-CM

## 2013-10-13 DIAGNOSIS — R7309 Other abnormal glucose: Secondary | ICD-10-CM

## 2013-10-13 LAB — BASIC METABOLIC PANEL
BUN: 14 mg/dL (ref 6–23)
CHLORIDE: 104 meq/L (ref 96–112)
CO2: 28 mEq/L (ref 19–32)
Calcium: 9.8 mg/dL (ref 8.4–10.5)
Creatinine, Ser: 0.6 mg/dL (ref 0.4–1.2)
GFR: 124.65 mL/min (ref >60.00)
Glucose, Bld: 90 mg/dL (ref 70–99)
POTASSIUM: 3.5 meq/L (ref 3.5–5.1)
SODIUM: 138 meq/L (ref 135–145)

## 2013-10-13 LAB — HEMOGLOBIN A1C: HEMOGLOBIN A1C: 6.3 % (ref 4.6–6.5)

## 2013-10-13 LAB — CBC
HCT: 37.5 % (ref 36.0–46.0)
Hemoglobin: 12.2 g/dL (ref 12.0–15.0)
MCHC: 32.5 g/dL (ref 30.0–36.0)
MCV: 88.6 fl (ref 78.0–100.0)
Platelets: 265 10*3/uL (ref 150.0–400.0)
RBC: 4.24 Mil/uL (ref 3.87–5.11)
RDW: 14.4 % (ref 11.5–14.6)
WBC: 5 10*3/uL (ref 4.5–10.5)

## 2013-10-13 LAB — TSH: TSH: 0.37 u[IU]/mL (ref 0.35–5.50)

## 2013-10-13 LAB — T4, FREE: Free T4: 1.45 ng/dL (ref 0.60–1.60)

## 2013-10-13 MED ORDER — LEVOTHYROXINE SODIUM 88 MCG PO TABS: 88.0000 ug | ORAL_TABLET | Freq: Every day | ORAL | Status: DC

## 2013-10-13 NOTE — Progress Notes (Signed)
Pre visit review using our clinic review tool, if applicable. No additional management support is needed unless otherwise documented below in the visit note. 

## 2013-10-13 NOTE — Patient Instructions (Signed)
Increase your protein intake and fat intake (unsaturated).

## 2013-10-13 NOTE — Progress Notes (Signed)
   Subjective:    Patient ID: Becky RodriguezJinhwa Holtzclaw, female    DOB: 1965-12-13, 48 y.o.   MRN: [id number]  HPI  48 year old white female with history of hypothyroidism, multinodular goiter, iron deficiency anemia and abnormal glucose for followup. Patient denies significant interval medical history. Patient experiencing occasional hot flashes. Patient is likely menopausal.  Abnormal glucose-patient decreased her carbohydrate intake. Patient has lost approximately 10 pounds since previous visit.  She reports she has been under weight for most of her life.  Iron deficiency anemia-her fatigue has significantly improved. She did not take iron supplements.  Review of Systems Weight loss, negative for fatigue    Past Medical History  Diagnosis Date  . Multinodular goiter   . Hypothyroidism   . Anemia     History   Social History  . Marital Status: Married    Spouse Name: N/A    Number of Children: N/A  . Years of Education: N/A   Occupational History  . Not on file.   Social History Main Topics  . Smoking status: Never Smoker   . Smokeless tobacco: Not on file  . Alcohol Use: No  . Drug Use: No  . Sexual Activity: Yes    Birth Control/ Protection: IUD   Other Topics Concern  . Not on file   Social History Narrative  . No narrative on file    Past Surgical History  Procedure Laterality Date  . Breast biopsy    . Ovarian cyst surgery  10/05  . Uterine fibroid surgery  02/11/2012    [place]    Family History  Problem Relation Age of Onset  . Hyperlipidemia Neg Hx   . Hypertension Neg Hx   . Stroke Neg Hx   . Diabetes Neg Hx   . Heart disease Neg Hx   . Cancer Neg Hx     breast, colon, and prostate    No Known Allergies  Current Outpatient Prescriptions on File Prior to Visit  Medication Sig Dispense Refill  . Cholecalciferol (VITAMIN D3) 2000 UNITS capsule Take 1 capsule (2,000 Units total) by mouth daily.      Marland Kitchen. levothyroxine (SYNTHROID,  LEVOTHROID) 88 MCG tablet Take 1 tablet (88 mcg total) by mouth daily.  90 tablet  3  . Multiple Vitamin (MULITIVITAMIN WITH MINERALS) TABS Take 1 tablet by mouth daily.      . iron polysaccharides (NIFEREX) 150 MG capsule Take 1 capsule (150 mg total) by mouth 2 (two) times daily.  120 capsule  1   No current facility-administered medications on file prior to visit.    BP 112/64  Pulse 84  Temp(Src) 98.6 F (37 C) (Oral)  Ht 5\' 4"  (1.626 m)  Wt 116 lb (52.617 kg)  BMI 19.90 kg/m2    Objective:   Physical Exam  Constitutional: She is oriented to person, place, and time. She appears well-developed and well-nourished. No distress.  Neck: Neck supple. No thyromegaly present.  Cardiovascular: Normal rate, regular rhythm and normal heart sounds.   No murmur heard. Pulmonary/Chest: Effort normal and breath sounds normal. She has no wheezes.  Neurological: She is alert and oriented to person, place, and time. No cranial nerve deficit.  Psychiatric: She has a normal mood and affect. Her behavior is normal.          Assessment & Plan:

## 2013-10-13 NOTE — Assessment & Plan Note (Signed)
Monitor CBC. I suspect her iron deficiency not resolved since she is menopausal.

## 2013-10-13 NOTE — Assessment & Plan Note (Signed)
Patient reports decreasing her carbohydrate intake. Patient has lost approximately 10 pounds. Patient encouraged to "makeup" calories through higher intake of proteins and fats.  Lab Results  Component Value Date   HGBA1C 6.3 10/13/2013   Consider check C peptide levels and GAD antibodies.

## 2013-10-13 NOTE — Assessment & Plan Note (Signed)
I doubt her weight loss secondary to overtreatment of hypothyroidism. Monitor thyroid function studies. Lab Results  Component Value Date   TSH 0.37 10/13/2013

## 2013-10-20 ENCOUNTER — Telehealth: Payer: Self-pay | Admitting: Internal Medicine

## 2013-10-20 NOTE — Telephone Encounter (Signed)
Pt stopped by to get labs.  Can you send pt a copy. Refill has already been sent in

## 2013-10-21 NOTE — Telephone Encounter (Signed)
Labs mailed

## 2014-04-07 ENCOUNTER — Encounter: Payer: Self-pay | Admitting: Internal Medicine

## 2014-04-07 ENCOUNTER — Ambulatory Visit (INDEPENDENT_AMBULATORY_CARE_PROVIDER_SITE_OTHER): Payer: BC Managed Care – PPO | Admitting: Internal Medicine

## 2014-04-07 VITALS — BP 116/80 | HR 76 | Temp 98.2°F | Ht 64.0 in | Wt 120.0 lb

## 2014-04-07 DIAGNOSIS — R7309 Other abnormal glucose: Secondary | ICD-10-CM

## 2014-04-07 DIAGNOSIS — E038 Other specified hypothyroidism: Secondary | ICD-10-CM

## 2014-04-07 NOTE — Progress Notes (Signed)
   Subjective:    Patient ID: Becky RodriguezJinhwa Judon, female    DOB: September 19, 1965, 48 y.o.   MRN: [id number]  HPI  48 year old Asian female with history of hypothyroidism, abnormal glucose and anemia for routine followup. Patient denies significant interval history. Patient has been following a low carbohydrate diet and exercising when she is able.  She denies polyuria polydipsia.  Preventative health care-her last screening mammogram was in 2012  Review of Systems No significant weight change    Past Medical History  Diagnosis Date  . Multinodular goiter   . Hypothyroidism   . Anemia   . Abnormal glucose     History   Social History  . Marital Status: Married    Spouse Name: N/A    Number of Children: N/A  . Years of Education: N/A   Occupational History  . Not on file.   Social History Main Topics  . Smoking status: Never Smoker   . Smokeless tobacco: Not on file  . Alcohol Use: No  . Drug Use: No  . Sexual Activity: Yes    Birth Control/ Protection: IUD   Other Topics Concern  . Not on file   Social History Narrative  . No narrative on file    Past Surgical History  Procedure Laterality Date  . Breast biopsy    . Ovarian cyst surgery  10/05  . Uterine fibroid surgery  02/11/2012    [place]    Family History  Problem Relation Age of Onset  . Hyperlipidemia Neg Hx   . Hypertension Neg Hx   . Stroke Neg Hx   . Diabetes Neg Hx   . Heart disease Neg Hx   . Cancer Neg Hx     breast, colon, and prostate    No Known Allergies  Current Outpatient Prescriptions on File Prior to Visit  Medication Sig Dispense Refill  . Cholecalciferol (VITAMIN D3) 2000 UNITS capsule Take 1 capsule (2,000 Units total) by mouth daily.      Marland Kitchen. levothyroxine (SYNTHROID, LEVOTHROID) 88 MCG tablet Take 1 tablet (88 mcg total) by mouth daily.  90 tablet  1  . Multiple Vitamin (MULITIVITAMIN WITH MINERALS) TABS Take 1 tablet by mouth daily.       No current  facility-administered medications on file prior to visit.    BP 116/80  Pulse 76  Temp(Src) 98.2 F (36.8 C) (Oral)  Ht 5\' 4"  (1.626 m)  Wt 120 lb (54.432 kg)  BMI 20.59 kg/m2    Objective:   Physical Exam  Constitutional: She is oriented to person, place, and time. She appears well-developed and well-nourished. No distress.  Neck: Neck supple. No thyromegaly present.  Cardiovascular: Normal rate, regular rhythm and normal heart sounds.   No murmur heard. Pulmonary/Chest: Effort normal and breath sounds normal. She has no wheezes.  Musculoskeletal: She exhibits no edema.  Neurological: She is alert and oriented to person, place, and time. No cranial nerve deficit.  Psychiatric: She has a normal mood and affect. Her behavior is normal.          Assessment & Plan:

## 2014-04-07 NOTE — Assessment & Plan Note (Signed)
Monitor TFTs.  Adjust thyroid replacement dose accordingly.

## 2014-04-07 NOTE — Patient Instructions (Signed)
Please schedule a screening mammogram as directed

## 2014-04-07 NOTE — Assessment & Plan Note (Signed)
Monitor A1c.  Diet controlled.

## 2014-04-08 LAB — BASIC METABOLIC PANEL
BUN: 10 mg/dL (ref 6–23)
CO2: 27 mEq/L (ref 19–32)
Calcium: 9.9 mg/dL (ref 8.4–10.5)
Chloride: 104 mEq/L (ref 96–112)
Creatinine, Ser: 0.5 mg/dL (ref 0.4–1.2)
GFR: 145.56 mL/min (ref >60.00)
Glucose, Bld: 112 mg/dL — ABNORMAL HIGH (ref 70–99)
Potassium: 3.6 mEq/L (ref 3.5–5.1)
SODIUM: 140 meq/L (ref 135–145)

## 2014-04-08 LAB — HEMOGLOBIN A1C: HEMOGLOBIN A1C: 6.1 % (ref 4.6–6.5)

## 2014-04-09 LAB — TSH: TSH: 0.22 u[IU]/mL — ABNORMAL LOW (ref 0.35–4.50)

## 2014-04-10 ENCOUNTER — Other Ambulatory Visit: Payer: Self-pay | Admitting: Internal Medicine

## 2014-04-13 ENCOUNTER — Other Ambulatory Visit: Payer: Self-pay | Admitting: *Deleted

## 2014-04-13 MED ORDER — LEVOTHYROXINE SODIUM 75 MCG PO TABS
75.0000 ug | ORAL_TABLET | Freq: Every day | ORAL | Status: DC
Start: 2014-04-13 — End: 2014-06-21

## 2014-06-15 ENCOUNTER — Other Ambulatory Visit (INDEPENDENT_AMBULATORY_CARE_PROVIDER_SITE_OTHER): Payer: BC Managed Care – PPO

## 2014-06-15 ENCOUNTER — Other Ambulatory Visit: Payer: Self-pay | Admitting: Internal Medicine

## 2014-06-15 DIAGNOSIS — R946 Abnormal results of thyroid function studies: Secondary | ICD-10-CM

## 2014-06-15 LAB — TSH: TSH: 0.71 u[IU]/mL (ref 0.35–4.50)

## 2014-06-17 ENCOUNTER — Other Ambulatory Visit: Payer: Self-pay | Admitting: Nurse Practitioner

## 2014-06-21 MED ORDER — LEVOTHYROXINE SODIUM 75 MCG PO TABS: 75.0000 ug | ORAL_TABLET | Freq: Every day | ORAL | Status: DC

## 2014-06-21 NOTE — Addendum Note (Signed)
Addended by: Azucena FreedMILLNER, Katyra Tomassetti C on: 06/21/2014 03:27 PM   Modules accepted: Orders

## 2014-06-21 NOTE — Addendum Note (Signed)
Addended by: Azucena FreedMILLNER, Biviana Saddler C on: 06/21/2014 02:09 PM   Modules accepted: Orders

## 2014-12-23 ENCOUNTER — Telehealth: Payer: Self-pay | Admitting: Internal Medicine

## 2014-12-23 NOTE — Telephone Encounter (Addendum)
Pt has appt 5/20 for fu on tsh.  Pt would like to sch labs prior to that appt. Is that ok?  They order in the system has expired.

## 2014-12-26 NOTE — Telephone Encounter (Signed)
The order doesn't expire until November 2016.  Go ahead and schedule lab appt

## 2014-12-26 NOTE — Telephone Encounter (Signed)
Not sure why this was forwarded to me. [facility]

## 2014-12-26 NOTE — Telephone Encounter (Signed)
Lm on vm to cb and sch °

## 2014-12-26 NOTE — Telephone Encounter (Signed)
done

## 2014-12-30 ENCOUNTER — Other Ambulatory Visit (INDEPENDENT_AMBULATORY_CARE_PROVIDER_SITE_OTHER): Payer: BLUE CROSS/BLUE SHIELD

## 2014-12-30 DIAGNOSIS — R7309 Other abnormal glucose: Secondary | ICD-10-CM

## 2014-12-30 DIAGNOSIS — R946 Abnormal results of thyroid function studies: Secondary | ICD-10-CM | POA: Diagnosis not present

## 2014-12-30 LAB — BASIC METABOLIC PANEL
BUN: 14 mg/dL (ref 6–23)
CO2: 30 mEq/L (ref 19–32)
Calcium: 10.1 mg/dL (ref 8.4–10.5)
Chloride: 102 mEq/L (ref 96–112)
Creatinine, Ser: 0.51 mg/dL (ref 0.40–1.20)
GFR: 135.33 mL/min (ref >60.00)
Glucose, Bld: 91 mg/dL (ref 70–99)
Potassium: 3.7 mEq/L (ref 3.5–5.1)
SODIUM: 136 meq/L (ref 135–145)

## 2014-12-30 LAB — HEMOGLOBIN A1C: Hgb A1c MFr Bld: 6 % (ref 4.6–6.5)

## 2014-12-30 LAB — TSH: TSH: 0.58 u[IU]/mL (ref 0.35–4.50)

## 2014-12-30 NOTE — Addendum Note (Signed)
Addended by: Rita OharaHRASHER, Sonna Lipsky R on: 12/30/2014 12:00 PM   Modules accepted: Orders

## 2015-01-06 ENCOUNTER — Ambulatory Visit: Payer: Self-pay | Admitting: Internal Medicine

## 2015-06-20 ENCOUNTER — Other Ambulatory Visit (INDEPENDENT_AMBULATORY_CARE_PROVIDER_SITE_OTHER): Payer: BLUE CROSS/BLUE SHIELD

## 2015-06-20 DIAGNOSIS — Z0000 Encounter for general adult medical examination without abnormal findings: Secondary | ICD-10-CM

## 2015-06-20 LAB — LIPID PANEL
CHOL/HDL RATIO: 3
CHOLESTEROL: 181 mg/dL (ref 0–200)
HDL: 55.4 mg/dL (ref >39.00)
LDL Cholesterol: 95 mg/dL (ref 0–99)
NonHDL: 125.49
TRIGLYCERIDES: 153 mg/dL — AB (ref 0.0–149.0)
VLDL: 30.6 mg/dL (ref 0.0–40.0)

## 2015-06-20 LAB — BASIC METABOLIC PANEL
BUN: 13 mg/dL (ref 6–23)
CHLORIDE: 103 meq/L (ref 96–112)
CO2: 29 meq/L (ref 19–32)
Calcium: 10.3 mg/dL (ref 8.4–10.5)
Creatinine, Ser: 0.59 mg/dL (ref 0.40–1.20)
GFR: 114.17 mL/min (ref >60.00)
GLUCOSE: 108 mg/dL — AB (ref 70–99)
POTASSIUM: 4.2 meq/L (ref 3.5–5.1)
Sodium: 140 mEq/L (ref 135–145)

## 2015-06-20 LAB — POCT URINALYSIS DIPSTICK
BILIRUBIN UA: NEGATIVE
GLUCOSE UA: NEGATIVE
Ketones, UA: NEGATIVE
Nitrite, UA: NEGATIVE
PH UA: 7
Protein, UA: NEGATIVE
RBC UA: NEGATIVE
SPEC GRAV UA: 1.02
UROBILINOGEN UA: 0.2

## 2015-06-20 LAB — CBC WITH DIFFERENTIAL/PLATELET
BASOS PCT: 0.6 % (ref 0.0–3.0)
Basophils Absolute: 0 10*3/uL (ref 0.0–0.1)
EOS PCT: 3.9 % (ref 0.0–5.0)
Eosinophils Absolute: 0.2 10*3/uL (ref 0.0–0.7)
HCT: 40.1 % (ref 36.0–46.0)
Hemoglobin: 13.2 g/dL (ref 12.0–15.0)
LYMPHS ABS: 1.9 10*3/uL (ref 0.7–4.0)
Lymphocytes Relative: 30.4 % (ref 12.0–46.0)
MCHC: 32.9 g/dL (ref 30.0–36.0)
MCV: 90.7 fl (ref 78.0–100.0)
MONOS PCT: 8.9 % (ref 3.0–12.0)
Monocytes Absolute: 0.5 10*3/uL (ref 0.1–1.0)
NEUTROS ABS: 3.4 10*3/uL (ref 1.4–7.7)
Neutrophils Relative %: 56.2 % (ref 43.0–77.0)
Platelets: 281 10*3/uL (ref 150.0–400.0)
RBC: 4.42 Mil/uL (ref 3.87–5.11)
RDW: 13.2 % (ref 11.5–15.5)
WBC: 6.1 10*3/uL (ref 4.0–10.5)

## 2015-06-20 LAB — HEPATIC FUNCTION PANEL
ALBUMIN: 4.2 g/dL (ref 3.5–5.2)
ALT: 11 U/L (ref 0–35)
AST: 17 U/L (ref 0–37)
Alkaline Phosphatase: 104 U/L (ref 39–117)
Bilirubin, Direct: 0.1 mg/dL (ref 0.0–0.3)
Total Bilirubin: 0.4 mg/dL (ref 0.2–1.2)
Total Protein: 7.6 g/dL (ref 6.0–8.3)

## 2015-06-20 LAB — TSH: TSH: 2.66 u[IU]/mL (ref 0.35–4.50)

## 2015-06-20 LAB — MICROALBUMIN / CREATININE URINE RATIO
Creatinine,U: 96.5 mg/dL
MICROALB UR: 0.9 mg/dL (ref 0.0–1.9)
Microalb Creat Ratio: 0.9 mg/g (ref 0.0–30.0)

## 2015-06-20 LAB — HEMOGLOBIN A1C: HEMOGLOBIN A1C: 6 % (ref 4.6–6.5)

## 2015-06-23 ENCOUNTER — Ambulatory Visit (INDEPENDENT_AMBULATORY_CARE_PROVIDER_SITE_OTHER): Payer: BLUE CROSS/BLUE SHIELD | Admitting: Adult Health

## 2015-06-23 ENCOUNTER — Encounter: Payer: Self-pay | Admitting: Adult Health

## 2015-06-23 VITALS — BP 126/80 | Temp 98.0°F | Ht 63.75 in | Wt 118.7 lb

## 2015-06-23 DIAGNOSIS — E038 Other specified hypothyroidism: Secondary | ICD-10-CM | POA: Diagnosis not present

## 2015-06-23 DIAGNOSIS — Z0000 Encounter for general adult medical examination without abnormal findings: Secondary | ICD-10-CM

## 2015-06-23 MED ORDER — LEVOTHYROXINE SODIUM 75 MCG PO TABS: 75.0000 ug | ORAL_TABLET | Freq: Every day | ORAL | Status: DC

## 2015-06-23 NOTE — Patient Instructions (Addendum)
It was great meeting you today!  Your exam and blood work looked great! Continue to do what you are doing.   Try Black Cohosh for your hot flashes. You can buy this in pill or tea at any health food store.   Follow up with Dr. Shawna Orleans as you normally would.      Menopause is a normal process in which your reproductive ability comes to an end. This process happens gradually over a span of months to years, usually between the ages of 57 and 47. Menopause is complete when you have missed 12 consecutive menstrual periods. It is important to talk with your health care provider about some of the most common conditions that affect postmenopausal women, such as heart disease, cancer, and bone loss (osteoporosis). Adopting a healthy lifestyle and getting preventive care can help to promote your health and wellness. Those actions can also lower your chances of developing some of these common conditions. WHAT SHOULD I KNOW ABOUT MENOPAUSE? During menopause, you may experience a number of symptoms, such as:  Moderate-to-severe hot flashes.  Night sweats.  Decrease in sex drive.  Mood swings.  Headaches.  Tiredness.  Irritability.  Memory problems.  Insomnia. Choosing to treat or not to treat menopausal changes is an individual decision that you make with your health care provider. WHAT SHOULD I KNOW ABOUT HORMONE REPLACEMENT THERAPY AND SUPPLEMENTS? Hormone therapy products are effective for treating symptoms that are associated with menopause, such as hot flashes and night sweats. Hormone replacement carries certain risks, especially as you become older. If you are thinking about using estrogen or estrogen with progestin treatments, discuss the benefits and risks with your health care provider. WHAT SHOULD I KNOW ABOUT HEART DISEASE AND STROKE? Heart disease, heart attack, and stroke become more likely as you age. This may be due, in part, to the hormonal changes that your body experiences  during menopause. These can affect how your body processes dietary fats, triglycerides, and cholesterol. Heart attack and stroke are both medical emergencies. There are many things that you can do to help prevent heart disease and stroke:  Have your blood pressure checked at least every 1-2 years. High blood pressure causes heart disease and increases the risk of stroke.  If you are 42-76 years old, ask your health care provider if you should take aspirin to prevent a heart attack or a stroke.  Do not use any tobacco products, including cigarettes, chewing tobacco, or electronic cigarettes. If you need help quitting, ask your health care provider.  It is important to eat a healthy diet and maintain a healthy weight.  Be sure to include plenty of vegetables, fruits, low-fat dairy products, and lean protein.  Avoid eating foods that are high in solid fats, added sugars, or salt (sodium).  Get regular exercise. This is one of the most important things that you can do for your health.  Try to exercise for at least 150 minutes each week. The type of exercise that you do should increase your heart rate and make you sweat. This is known as moderate-intensity exercise.  Try to do strengthening exercises at least twice each week. Do these in addition to the moderate-intensity exercise.  Know your numbers.Ask your health care provider to check your cholesterol and your blood glucose. Continue to have your blood tested as directed by your health care provider. WHAT SHOULD I KNOW ABOUT CANCER SCREENING? There are several types of cancer. Take the following steps to reduce your risk and  to catch any cancer development as early as possible. Breast Cancer  Practice breast self-awareness.  This means understanding how your breasts normally appear and feel.  It also means doing regular breast self-exams. Let your health care provider know about any changes, no matter how small.  If you are 14 or  older, have a clinician do a breast exam (clinical breast exam or CBE) every year. Depending on your age, family history, and medical history, it may be recommended that you also have a yearly breast X-ray (mammogram).  If you have a family history of breast cancer, talk with your health care provider about genetic screening.  If you are at high risk for breast cancer, talk with your health care provider about having an MRI and a mammogram every year.  Breast cancer (BRCA) gene test is recommended for women who have family members with BRCA-related cancers. Results of the assessment will determine the need for genetic counseling and BRCA1 and for BRCA2 testing. BRCA-related cancers include these types:  Breast. This occurs in males or females.  Ovarian.  Tubal. This may also be called fallopian tube cancer.  Cancer of the abdominal or pelvic lining (peritoneal cancer).  Prostate.  Pancreatic. Cervical, Uterine, and Ovarian Cancer Your health care provider may recommend that you be screened regularly for cancer of the pelvic organs. These include your ovaries, uterus, and vagina. This screening involves a pelvic exam, which includes checking for microscopic changes to the surface of your cervix (Pap test).  For women ages 21-65, health care providers may recommend a pelvic exam and a Pap test every three years. For women ages 25-65, they may recommend the Pap test and pelvic exam, combined with testing for human papilloma virus (HPV), every five years. Some types of HPV increase your risk of cervical cancer. Testing for HPV may also be done on women of any age who have unclear Pap test results.  Other health care providers may not recommend any screening for nonpregnant women who are considered low risk for pelvic cancer and have no symptoms. Ask your health care provider if a screening pelvic exam is right for you.  If you have had past treatment for cervical cancer or a condition that  could lead to cancer, you need Pap tests and screening for cancer for at least 20 years after your treatment. If Pap tests have been discontinued for you, your risk factors (such as having a new sexual partner) need to be reassessed to determine if you should start having screenings again. Some women have medical problems that increase the chance of getting cervical cancer. In these cases, your health care provider may recommend that you have screening and Pap tests more often.  If you have a family history of uterine cancer or ovarian cancer, talk with your health care provider about genetic screening.  If you have vaginal bleeding after reaching menopause, tell your health care provider.  There are currently no reliable tests available to screen for ovarian cancer. Lung Cancer Lung cancer screening is recommended for adults 80-95 years old who are at high risk for lung cancer because of a history of smoking. A yearly low-dose CT scan of the lungs is recommended if you:  Currently smoke.  Have a history of at least 30 pack-years of smoking and you currently smoke or have quit within the past 15 years. A pack-year is smoking an average of one pack of cigarettes per day for one year. Yearly screening should:  Continue until  it has been 15 years since you quit.  Stop if you develop a health problem that would prevent you from having lung cancer treatment. Colorectal Cancer  This type of cancer can be detected and can often be prevented.  Routine colorectal cancer screening usually begins at age 40 and continues through age 27.  If you have risk factors for colon cancer, your health care provider may recommend that you be screened at an earlier age.  If you have a family history of colorectal cancer, talk with your health care provider about genetic screening.  Your health care provider may also recommend using home test kits to check for hidden blood in your stool.  A small camera at the  end of a tube can be used to examine your colon directly (sigmoidoscopy or colonoscopy). This is done to check for the earliest forms of colorectal cancer.  Direct examination of the colon should be repeated every 5-10 years until age 50. However, if early forms of precancerous polyps or small growths are found or if you have a family history or genetic risk for colorectal cancer, you may need to be screened more often. Skin Cancer  Check your skin from head to toe regularly.  Monitor any moles. Be sure to tell your health care provider:  About any new moles or changes in moles, especially if there is a change in a mole's shape or color.  If you have a mole that is larger than the size of a pencil eraser.  If any of your family members has a history of skin cancer, especially at a young age, talk with your health care provider about genetic screening.  Always use sunscreen. Apply sunscreen liberally and repeatedly throughout the day.  Whenever you are outside, protect yourself by wearing long sleeves, pants, a wide-brimmed hat, and sunglasses. WHAT SHOULD I KNOW ABOUT OSTEOPOROSIS? Osteoporosis is a condition in which bone destruction happens more quickly than new bone creation. After menopause, you may be at an increased risk for osteoporosis. To help prevent osteoporosis or the bone fractures that can happen because of osteoporosis, the following is recommended:  If you are 66-50 years old, get at least 1,000 mg of calcium and at least 600 mg of vitamin D per day.  If you are older than age 52 but younger than age 38, get at least 1,200 mg of calcium and at least 600 mg of vitamin D per day.  If you are older than age 52, get at least 1,200 mg of calcium and at least 800 mg of vitamin D per day. Smoking and excessive alcohol intake increase the risk of osteoporosis. Eat foods that are rich in calcium and vitamin D, and do weight-bearing exercises several times each week as directed by  your health care provider. WHAT SHOULD I KNOW ABOUT HOW MENOPAUSE AFFECTS [place]? Depression may occur at any age, but it is more common as you become older. Common symptoms of depression include:  Low or sad mood.  Changes in sleep patterns.  Changes in appetite or eating patterns.  Feeling an overall lack of motivation or enjoyment of activities that you previously enjoyed.  Frequent crying spells. Talk with your health care provider if you think that you are experiencing depression. WHAT SHOULD I KNOW ABOUT IMMUNIZATIONS? It is important that you get and maintain your immunizations. These include:  Tetanus, diphtheria, and pertussis (Tdap) booster vaccine.  Influenza every year before the flu season begins.  Pneumonia vaccine.  Shingles vaccine. Your health care provider may also recommend other immunizations.   This information is not intended to replace advice given to you by your health care provider. Make sure you discuss any questions you have with your health care provider.   Document Released: 09/27/2005 Document Revised: 08/26/2014 Document Reviewed: 04/07/2014 Elsevier Interactive Patient Education [employer].

## 2015-06-23 NOTE — Progress Notes (Signed)
Subjective:    Patient ID: Becky Fuentes, female    DOB: 1966/03/04, 49 y.o.   MRN: [id number]  HPI  Patient presents for yearly preventative medicine examination.  All immunizations and health maintenance protocols were reviewed with the patient and needed orders were placed.  Appropriate screening laboratory values were reviewed with for patient including screening of hyperlipidemia, renal function and hepatic function.   Medication reconciliation,  past medical history, social history, problem list and allergies were reviewed in detail with the patient  Goals were established with regard to weight loss, exercise, and  diet in compliance with medications  Denies any interval history.   She had a pap in 2015. Her mammogram is in December  Her only complaint is that of hot flashes for menopause.   She refused a Colonoscopy and [place] of her thyroid   Review of Systems  Constitutional: Negative.   HENT: Negative.   Eyes: Negative.   Respiratory: Negative.   Cardiovascular: Negative.   Gastrointestinal: Negative.   Endocrine: Negative.   Genitourinary: Negative.   Musculoskeletal: Negative.   Skin: Negative.   Allergic/Immunologic: Negative.   Neurological: Negative.   Hematological: Negative.   Psychiatric/Behavioral: Negative.   All other systems reviewed and are negative.  Past Medical History  Diagnosis Date  . Multinodular goiter   . Hypothyroidism   . Anemia   . Abnormal glucose     Social History   Social History  . Marital Status: Married    Spouse Name: N/A  . Number of Children: N/A  . Years of Education: N/A   Occupational History  . Not on file.   Social History Main Topics  . Smoking status: Never Smoker   . Smokeless tobacco: Not on file  . Alcohol Use: No  . Drug Use: No  . Sexual Activity: Yes    Birth Control/ Protection: IUD   Other Topics Concern  . Not on file   Social History Narrative    Past Surgical History  Procedure  Laterality Date  . Breast biopsy    . Ovarian cyst surgery  10/05  . Uterine fibroid surgery  02/11/2012    [place]    Family History  Problem Relation Age of Onset  . Hyperlipidemia Neg Hx   . Hypertension Neg Hx   . Stroke Neg Hx   . Diabetes Neg Hx   . Heart disease Neg Hx   . Cancer Neg Hx     breast, colon, and prostate    No Known Allergies  Current Outpatient Prescriptions on File Prior to Visit  Medication Sig Dispense Refill  . Cholecalciferol (VITAMIN D3) 2000 UNITS capsule Take 1 capsule (2,000 Units total) by mouth daily.    . Multiple Vitamin (MULITIVITAMIN WITH MINERALS) TABS Take 1 tablet by mouth daily.     No current facility-administered medications on file prior to visit.    BP 126/80 mmHg  Temp(Src) 98 F (36.7 C) (Oral)  Ht 5' 3.75" (1.619 m)  Wt 118 lb 11.2 oz (53.842 kg)  BMI 20.54 kg/m2       Objective:   Physical Exam  Constitutional: She is oriented to person, place, and time. She appears well-developed and well-nourished. No distress.  HENT:  Head: Normocephalic and atraumatic.  Right Ear: External ear normal.  Left Ear: External ear normal.  Nose: Nose normal.  Mouth/Throat: Oropharynx is clear and moist. No oropharyngeal exudate.  Eyes: Conjunctivae are normal. Pupils are equal, round, and reactive  to light. Right eye exhibits no discharge. Left eye exhibits no discharge. No scleral icterus.  Neck: Normal range of motion. Neck supple. No tracheal deviation present. Thyromegaly (chronic) present.  Cardiovascular: Normal rate, regular rhythm, normal heart sounds and intact distal pulses.  Exam reveals no gallop and no friction rub.   No murmur heard. No carotid Bruit  Pulmonary/Chest: Effort normal and breath sounds normal. No respiratory distress. She has no wheezes. She has no rales. She exhibits no tenderness.  Abdominal: Soft. Bowel sounds are normal. She exhibits no distension and no mass. There is no  tenderness. There is no rebound and no guarding.  Genitourinary:  Refused  Musculoskeletal: Normal range of motion.  Lymphadenopathy:    She has no cervical adenopathy.  Neurological: She is alert and oriented to person, place, and time. She has normal reflexes. No cranial nerve deficit. Coordination normal.  Skin: Skin is warm and dry. No rash noted. She is not diaphoretic. No erythema. No pallor.  Psychiatric: She has a normal mood and affect. Her behavior is normal. Judgment and thought content normal.  Nursing note and vitals reviewed.     Assessment & Plan:  1. Routine general medical examination at a health care facility - Follow up in one year for CPE - Follow up sooner if needed  2. Other specified hypothyroidism - Continue with current dose of synthroid - Her last US was in 2011 for enlarged thyroid. She does not want to have another one at this time

## 2015-06-23 NOTE — Progress Notes (Signed)
Pre visit review using our clinic review tool, if applicable. No additional management support is needed unless otherwise documented below in the visit note. 

## 2016-06-03 ENCOUNTER — Encounter: Payer: Self-pay | Admitting: *Deleted

## 2016-06-29 ENCOUNTER — Other Ambulatory Visit: Payer: Self-pay | Admitting: Adult Health

## 2016-07-01 NOTE — Telephone Encounter (Signed)
Ok to refill for 30 days. Needs to establish with a new provider for further refills

## 2016-07-01 NOTE — Telephone Encounter (Signed)
Ok to refill 

## 2017-05-04 ENCOUNTER — Encounter: Payer: Self-pay | Admitting: *Deleted

## 2017-05-04 DIAGNOSIS — Z23 Encounter for immunization: Secondary | ICD-10-CM

## 2018-05-26 ENCOUNTER — Encounter: Payer: Self-pay | Admitting: *Deleted

## 2022-06-17 ENCOUNTER — Ambulatory Visit: Payer: BLUE CROSS/BLUE SHIELD

## 2022-06-17 DIAGNOSIS — Z23 Encounter for immunization: Secondary | ICD-10-CM
